# Patient Record
Sex: Female | Born: 1985 | Hispanic: No | Marital: Married | Smoking: Never smoker
Health system: Southern US, Community
[De-identification: ages and names within clinical notes are randomized; demographics above are authoritative.]

## PROBLEM LIST (undated history)

## (undated) ENCOUNTER — Inpatient Hospital Stay: Payer: Self-pay

## (undated) DIAGNOSIS — N83209 Unspecified ovarian cyst, unspecified side: Secondary | ICD-10-CM

## (undated) DIAGNOSIS — O09299 Supervision of pregnancy with other poor reproductive or obstetric history, unspecified trimester: Secondary | ICD-10-CM

## (undated) DIAGNOSIS — F419 Anxiety disorder, unspecified: Secondary | ICD-10-CM

## (undated) DIAGNOSIS — F909 Attention-deficit hyperactivity disorder, unspecified type: Secondary | ICD-10-CM

## (undated) DIAGNOSIS — M2010 Hallux valgus (acquired), unspecified foot: Secondary | ICD-10-CM

## (undated) DIAGNOSIS — N939 Abnormal uterine and vaginal bleeding, unspecified: Secondary | ICD-10-CM

## (undated) HISTORY — DX: Supervision of pregnancy with other poor reproductive or obstetric history, unspecified trimester: O09.299

## (undated) HISTORY — PX: NO PAST SURGERIES: SHX2092

## (undated) HISTORY — DX: Anxiety disorder, unspecified: F41.9

## (undated) HISTORY — DX: Attention-deficit hyperactivity disorder, unspecified type: F90.9

## (undated) HISTORY — DX: Abnormal uterine and vaginal bleeding, unspecified: N93.9

## (undated) HISTORY — DX: Hallux valgus (acquired), unspecified foot: M20.10

## (undated) HISTORY — DX: Unspecified ovarian cyst, unspecified side: N83.209

---
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
## 2005-03-21 ENCOUNTER — Emergency Department: Payer: Self-pay | Admitting: Emergency Medicine

## 2005-07-30 ENCOUNTER — Ambulatory Visit: Payer: Self-pay | Admitting: Obstetrics and Gynecology

## 2005-09-10 ENCOUNTER — Observation Stay: Payer: Self-pay

## 2005-09-16 ENCOUNTER — Inpatient Hospital Stay: Payer: Self-pay | Admitting: Obstetrics and Gynecology

## 2006-11-18 ENCOUNTER — Ambulatory Visit: Payer: Self-pay | Admitting: Surgery

## 2007-11-01 ENCOUNTER — Emergency Department: Payer: Self-pay | Admitting: Emergency Medicine

## 2008-03-23 HISTORY — PX: AUGMENTATION MAMMAPLASTY: SUR837

## 2008-04-06 ENCOUNTER — Ambulatory Visit: Payer: Self-pay | Admitting: Obstetrics and Gynecology

## 2008-04-09 ENCOUNTER — Ambulatory Visit: Payer: Self-pay | Admitting: Obstetrics and Gynecology

## 2008-04-30 ENCOUNTER — Ambulatory Visit: Payer: Self-pay | Admitting: Obstetrics and Gynecology

## 2008-08-04 ENCOUNTER — Emergency Department: Payer: Self-pay | Admitting: Emergency Medicine

## 2008-12-13 ENCOUNTER — Encounter: Payer: Self-pay | Admitting: Obstetrics and Gynecology

## 2009-02-07 ENCOUNTER — Emergency Department: Payer: Self-pay | Admitting: Emergency Medicine

## 2009-02-20 DIAGNOSIS — Z9889 Other specified postprocedural states: Secondary | ICD-10-CM

## 2009-05-16 ENCOUNTER — Observation Stay: Payer: Self-pay | Admitting: Obstetrics and Gynecology

## 2009-06-04 ENCOUNTER — Observation Stay: Payer: Self-pay

## 2009-06-07 ENCOUNTER — Inpatient Hospital Stay: Payer: Self-pay

## 2010-03-09 ENCOUNTER — Emergency Department: Payer: Self-pay | Admitting: Emergency Medicine

## 2013-05-05 ENCOUNTER — Ambulatory Visit: Payer: Self-pay

## 2013-11-13 ENCOUNTER — Ambulatory Visit: Payer: Self-pay

## 2013-11-13 LAB — HM MAMMOGRAPHY

## 2014-10-30 ENCOUNTER — Encounter: Payer: Self-pay | Admitting: Obstetrics and Gynecology

## 2014-11-09 ENCOUNTER — Ambulatory Visit: Payer: PRIVATE HEALTH INSURANCE

## 2014-11-09 ENCOUNTER — Ambulatory Visit (INDEPENDENT_AMBULATORY_CARE_PROVIDER_SITE_OTHER): Payer: PRIVATE HEALTH INSURANCE | Admitting: Obstetrics and Gynecology

## 2014-11-09 ENCOUNTER — Other Ambulatory Visit: Payer: Self-pay | Admitting: Obstetrics and Gynecology

## 2014-11-09 VITALS — BP 118/76 | HR 63 | Ht 63.0 in | Wt 123.4 lb

## 2014-11-09 DIAGNOSIS — O3680X0 Pregnancy with inconclusive fetal viability, not applicable or unspecified: Secondary | ICD-10-CM

## 2014-11-09 DIAGNOSIS — Z8759 Personal history of other complications of pregnancy, childbirth and the puerperium: Principal | ICD-10-CM

## 2014-11-09 DIAGNOSIS — Z113 Encounter for screening for infections with a predominantly sexual mode of transmission: Secondary | ICD-10-CM

## 2014-11-09 DIAGNOSIS — Z1389 Encounter for screening for other disorder: Secondary | ICD-10-CM

## 2014-11-09 DIAGNOSIS — Z36 Encounter for antenatal screening for chromosomal anomalies: Secondary | ICD-10-CM

## 2014-11-09 DIAGNOSIS — O09291 Supervision of pregnancy with other poor reproductive or obstetric history, first trimester: Secondary | ICD-10-CM

## 2014-11-09 DIAGNOSIS — Z369 Encounter for antenatal screening, unspecified: Secondary | ICD-10-CM

## 2014-11-09 DIAGNOSIS — Z331 Pregnant state, incidental: Secondary | ICD-10-CM

## 2014-11-09 LAB — OB RESULTS CONSOLE ABO/RH: RH Type: POSITIVE

## 2014-11-09 LAB — OB RESULTS CONSOLE RUBELLA ANTIBODY, IGM: RUBELLA: NON-IMMUNE/NOT IMMUNE

## 2014-11-09 LAB — OB RESULTS CONSOLE VARICELLA ZOSTER ANTIBODY, IGG: Varicella: NON-IMMUNE/NOT IMMUNE

## 2014-11-09 NOTE — Progress Notes (Cosign Needed)
Brittany Olson for [name] nurse interview visit. G-5.  P-2022. Pregnancy eduction material explained and given. No cats in the home. NOB labs ordered.  HIV screened, pt was informed she may opt out if desires but did not.. Drug screen decline. PNV encouraged. NT ordered to discuss with provider. Pt. To follow up with provider in 2 weeks for NOB physical.  Pt having US done today due to hx of miscarriages and dating.  Has seen [facility] in the past and had genetic testing done but unsure of what. No vaginal bleeding but cramping more than period like, pt states "noticable".   All questions answered.  DOES NOT WANT TO KNOW SEX OF BABY!!!!  ZIKA EXPOSURE SCREEN:  The patient has not traveled to a [place] Virus endemic area within the past 6 months, nor has she had unprotected sex with a partner who has travelled to a [place] endemic region within the past 6 months. The patient has been advised to notify us if these factors change any time during this current pregnancy, so adequate testing and monitoring can be initiated.

## 2014-11-10 LAB — ABO

## 2014-11-10 LAB — URINALYSIS, ROUTINE W REFLEX MICROSCOPIC
Bilirubin, UA: NEGATIVE
Glucose, UA: NEGATIVE
Ketones, UA: NEGATIVE
Leukocytes, UA: NEGATIVE
Nitrite, UA: NEGATIVE
PH UA: 6 (ref 5.0–7.5)
PROTEIN UA: NEGATIVE
RBC UA: NEGATIVE
Specific Gravity, UA: 1.021 (ref 1.005–1.030)
UUROB: 0.2 mg/dL (ref 0.2–1.0)

## 2014-11-10 LAB — HEPATITIS B SURFACE ANTIGEN: Hepatitis B Surface Ag: NEGATIVE

## 2014-11-10 LAB — CBC WITH DIFFERENTIAL/PLATELET
BASOS ABS: 0 10*3/uL (ref 0.0–0.2)
BASOS: 0 %
EOS (ABSOLUTE): 0.1 10*3/uL (ref 0.0–0.4)
Eos: 1 %
Hematocrit: 39.2 % (ref 34.0–46.6)
Hemoglobin: 13.5 g/dL (ref 11.1–15.9)
Immature Grans (Abs): 0 10*3/uL (ref 0.0–0.1)
Immature Granulocytes: 0 %
LYMPHS ABS: 1.4 10*3/uL (ref 0.7–3.1)
LYMPHS: 21 %
MCH: 30.6 pg (ref 26.6–33.0)
MCHC: 34.4 g/dL (ref 31.5–35.7)
MCV: 89 fL (ref 79–97)
MONOS ABS: 0.6 10*3/uL (ref 0.1–0.9)
Monocytes: 8 %
NEUTROS ABS: 4.6 10*3/uL (ref 1.4–7.0)
Neutrophils: 70 %
PLATELETS: 188 10*3/uL (ref 150–379)
RBC: 4.41 x10E6/uL (ref 3.77–5.28)
RDW: 13.7 % (ref 12.3–15.4)
WBC: 6.6 10*3/uL (ref 3.4–10.8)

## 2014-11-10 LAB — RH TYPE: Rh Factor: POSITIVE

## 2014-11-10 LAB — RPR: RPR: NONREACTIVE

## 2014-11-10 LAB — RUBELLA ANTIBODY, IGM: Rubella IgM: <20 AU/mL (ref 0.0–19.9)

## 2014-11-10 LAB — ANTIBODY SCREEN: ANTIBODY SCREEN: NEGATIVE

## 2014-11-10 LAB — HIV ANTIBODY (ROUTINE TESTING W REFLEX): HIV SCREEN 4TH GENERATION: NONREACTIVE

## 2014-11-11 LAB — GC/CHLAMYDIA PROBE AMP
CHLAMYDIA, DNA PROBE: NEGATIVE
Neisseria gonorrhoeae by PCR: NEGATIVE

## 2014-11-11 LAB — URINE CULTURE: ORGANISM ID, BACTERIA: NO GROWTH

## 2014-11-12 LAB — VARICELLA ZOSTER ANTIBODY, IGM

## 2014-11-13 ENCOUNTER — Other Ambulatory Visit: Payer: Self-pay | Admitting: Obstetrics and Gynecology

## 2014-11-13 DIAGNOSIS — O9989 Other specified diseases and conditions complicating pregnancy, childbirth and the puerperium: Principal | ICD-10-CM

## 2014-11-13 DIAGNOSIS — O09899 Supervision of other high risk pregnancies, unspecified trimester: Secondary | ICD-10-CM

## 2014-11-13 DIAGNOSIS — Z283 Underimmunization status: Secondary | ICD-10-CM

## 2014-12-04 ENCOUNTER — Encounter: Payer: Self-pay | Admitting: Obstetrics and Gynecology

## 2014-12-04 ENCOUNTER — Other Ambulatory Visit: Payer: Self-pay | Admitting: Obstetrics and Gynecology

## 2014-12-04 ENCOUNTER — Ambulatory Visit (INDEPENDENT_AMBULATORY_CARE_PROVIDER_SITE_OTHER): Payer: PRIVATE HEALTH INSURANCE | Admitting: Obstetrics and Gynecology

## 2014-12-04 VITALS — BP 107/71 | HR 78 | Wt 125.4 lb

## 2014-12-04 DIAGNOSIS — Z3491 Encounter for supervision of normal pregnancy, unspecified, first trimester: Secondary | ICD-10-CM

## 2014-12-04 DIAGNOSIS — Z3481 Encounter for supervision of other normal pregnancy, first trimester: Secondary | ICD-10-CM

## 2014-12-04 LAB — POCT URINALYSIS DIPSTICK
Bilirubin, UA: NEGATIVE
Glucose, UA: NEGATIVE
KETONES UA: NEGATIVE
Leukocytes, UA: NEGATIVE
Nitrite, UA: NEGATIVE
PROTEIN UA: NEGATIVE
RBC UA: NEGATIVE
SPEC GRAV UA: 1.02
UROBILINOGEN UA: NEGATIVE
pH, UA: 6

## 2014-12-04 NOTE — Patient Instructions (Signed)
Second Trimester of Pregnancy The second trimester is from week 13 through week 28, months 4 through 6. The second trimester is often a time when you feel your best. Your body has also adjusted to being pregnant, and you begin to feel better physically. Usually, morning sickness has lessened or quit completely, you may have more energy, and you may have an increase in appetite. The second trimester is also a time when the fetus is growing rapidly. At the end of the sixth month, the fetus is about 9 inches long and weighs about 1 pounds. You will likely begin to feel the baby move (quickening) between 18 and 20 weeks of the pregnancy. BODY CHANGES Your body goes through many changes during pregnancy. The changes vary from woman to woman.   Your weight will continue to increase. You will notice your lower abdomen bulging out.  You may begin to get stretch marks on your hips, abdomen, and breasts.  You may develop headaches that can be relieved by medicines approved by your health care provider.  You may urinate more often because the fetus is pressing on your bladder.  You may develop or continue to have heartburn as a result of your pregnancy.  You may develop constipation because certain hormones are causing the muscles that push waste through your intestines to slow down.  You may develop hemorrhoids or swollen, bulging veins (varicose veins).  You may have back pain because of the weight gain and pregnancy hormones relaxing your joints between the bones in your pelvis and as a result of a shift in weight and the muscles that support your balance.  Your breasts will continue to grow and be tender.  Your gums may bleed and may be sensitive to brushing and flossing.  Dark spots or blotches (chloasma, mask of pregnancy) may develop on your face. This will likely fade after the baby is born.  A dark line from your belly button to the pubic area (linea nigra) may appear. This will likely fade  after the baby is born.  You may have changes in your hair. These can include thickening of your hair, rapid growth, and changes in texture. Some women also have hair loss during or after pregnancy, or hair that feels dry or thin. Your hair will most likely return to normal after your baby is born. WHAT TO EXPECT AT YOUR PRENATAL VISITS During a routine prenatal visit:  You will be weighed to make sure you and the fetus are growing normally.  Your blood pressure will be taken.  Your abdomen will be measured to track your baby's growth.  The fetal heartbeat will be listened to.  Any test results from the previous visit will be discussed. Your health care provider may ask you:  How you are feeling.  If you are feeling the baby move.  If you have had any abnormal symptoms, such as leaking fluid, bleeding, severe headaches, or abdominal cramping.  If you have any questions. Other tests that may be performed during your second trimester include:  Blood tests that check for:  Low iron levels (anemia).  Gestational diabetes (between 24 and 28 weeks).  Rh antibodies.  Urine tests to check for infections, diabetes, or protein in the urine.  An ultrasound to confirm the proper growth and development of the baby.  An amniocentesis to check for possible genetic problems.  Fetal screens for spina bifida and Down syndrome. HOME CARE INSTRUCTIONS   Avoid all smoking, herbs, alcohol, and unprescribed   drugs. These chemicals affect the formation and growth of the baby.  Follow your health care provider's instructions regarding medicine use. There are medicines that are either safe or unsafe to take during pregnancy.  Exercise only as directed by your health care provider. Experiencing uterine cramps is a good sign to stop exercising.  Continue to eat regular, healthy meals.  Wear a good support bra for breast tenderness.  Do not use hot tubs, steam rooms, or saunas.  Wear your  seat belt at all times when driving.  Avoid raw meat, uncooked cheese, cat litter boxes, and soil used by cats. These carry germs that can cause birth defects in the baby.  Take your prenatal vitamins.  Try taking a stool softener (if your health care provider approves) if you develop constipation. Eat more high-fiber foods, such as fresh vegetables or fruit and whole grains. Drink plenty of fluids to keep your urine clear or pale yellow.  Take warm sitz baths to soothe any pain or discomfort caused by hemorrhoids. Use hemorrhoid cream if your health care provider approves.  If you develop varicose veins, wear support hose. Elevate your feet for 15 minutes, 3-4 times a day. Limit salt in your diet.  Avoid heavy lifting, wear low heel shoes, and practice good posture.  Rest with your legs elevated if you have leg cramps or low back pain.  Visit your dentist if you have not gone yet during your pregnancy. Use a soft toothbrush to brush your teeth and be gentle when you floss.  A sexual relationship may be continued unless your health care provider directs you otherwise.  Continue to go to all your prenatal visits as directed by your health care provider. SEEK MEDICAL CARE IF:   You have dizziness.  You have mild pelvic cramps, pelvic pressure, or nagging pain in the abdominal area.  You have persistent nausea, vomiting, or diarrhea.  You have a bad smelling vaginal discharge.  You have pain with urination. SEEK IMMEDIATE MEDICAL CARE IF:   You have a fever.  You are leaking fluid from your vagina.  You have spotting or bleeding from your vagina.  You have severe abdominal cramping or pain.  You have rapid weight gain or loss.  You have shortness of breath with chest pain.  You notice sudden or extreme swelling of your face, hands, ankles, feet, or legs.  You have not felt your baby move in over an hour.  You have severe headaches that do not go away with  medicine.  You have vision changes. Document Released: 03/03/2001 Document Revised: 03/14/2013 Document Reviewed: 05/10/2012 ExitCare Patient Information 2015 [employer]. This information is not intended to replace advice given to you by your health care provider. Make sure you discuss any questions you have with your health care provider.  

## 2014-12-04 NOTE — Progress Notes (Signed)
NEW OB HISTORY AND PHYSICAL  SUBJECTIVE:       Brittany Olson is a 29 y.o. [id number] female, Patient's last menstrual period was 09/17/2014., Estimated Date of Delivery: 06/24/15, [redacted]w[redacted]d, presents today for establishment of Prenatal Care. She has no unusual complaints and complains of fatigue and occasional low blood sugar      Gynecologic History Patient's last menstrual period was 09/17/2014. Normal Contraception: none Last Pap: unsure. Results were: normal  Obstetric History OB History  Gravida Para Term Preterm AB SAB TAB Ectopic Multiple Living  5 2  1 2 2    2     # Outcome Date GA Lbr Len/2nd Weight Sex Delivery Anes PTL Lv  5 Current           4 Preterm 06/06/09 [redacted]w[redacted]d  8 lb (3.629 kg) F Stormy Card Y  3 SAB 02/2009 [redacted]w[redacted]d       FD     Complications: H/O dilation and curettage  2 SAB 06/2008 [redacted]w[redacted]d       FD  1 Para 09/16/05 [redacted]w[redacted]d  8 lb 12 oz (3.969 kg) F Vag-Spont   Y      Past Medical History  Diagnosis Date  . Ovarian cyst     HISTORY  . Hallux valgus     ACQUIRED  . Perineal laceration during delivery, unspecified, delivered     FIRST DEGREE  . H/O miscarriage, currently pregnant     X2  . Abnormal uterine bleeding (AUB)     history    Past Surgical History  Procedure Laterality Date  . No past surgeries      Current Outpatient Prescriptions on File Prior to Visit  Medication Sig Dispense Refill  . Prenatal Vit-Fe Fumarate-FA (PRENATAL MULTIVITAMIN) TABS tablet Take 1 tablet by mouth daily at 12 noon.     No current facility-administered medications on file prior to visit.    No Known Allergies  Social History   Social History  . Marital Status: Married    Spouse Name: N/A  . Number of Children: N/A  . Years of Education: N/A   Occupational History  . HAIR DRESSER    Social History Main Topics  . Smoking status: Never Smoker   . Smokeless tobacco: Never Used  . Alcohol Use: No  . Drug Use: No  . Sexual Activity:    Partners: Male    Other Topics Concern  . Not on file   Social History Narrative    Family History  Problem Relation Age of Onset  . Prostate cancer Father     The following portions of the patient's history were reviewed and updated as appropriate: allergies, current medications, past OB history, past medical history, past surgical history, past family history, past social history, and problem list.    OBJECTIVE: Initial Physical Exam (New OB)  GENERAL APPEARANCE: alert, well appearing, in no apparent distress, oriented to person, place and time, well hydrated HEAD: normocephalic, atraumatic MOUTH: mucous membranes moist, pharynx normal without lesions THYROID: no thyromegaly or masses present BREASTS: no masses noted, no significant tenderness, no palpable axillary nodes, no skin changes; known fibrous mass noted in left upper outer breast LUNGS: clear to auscultation, no wheezes, rales or rhonchi, symmetric air entry HEART: regular rate and rhythm, no murmurs ABDOMEN: soft, nontender, nondistended, no abnormal masses, no epigastric pain, fundus soft, nontender 12 weeks size and FHT present EXTREMITIES: no redness or tenderness in the calves or thighs SKIN: normal coloration and turgor, no rashes  LYMPH NODES: no adenopathy palpable NEUROLOGIC: alert, oriented, normal speech, no focal findings or movement disorder noted  PELVIC EXAM EXTERNAL GENITALIA: normal appearing vulva with no masses, tenderness or lesions VAGINA: no abnormal discharge or lesions CERVIX: no lesions or cervical motion tenderness UTERUS: gravid and consistent with 12 weeks ADNEXA: no masses palpable and nontender  ASSESSMENT: Normal pregnancy  PLAN: Desires NT and Panarama- will schedule Prenatal care See orders

## 2014-12-05 LAB — CYTOLOGY - PAP

## 2014-12-13 ENCOUNTER — Telehealth: Payer: Self-pay | Admitting: Obstetrics and Gynecology

## 2014-12-13 NOTE — Telephone Encounter (Signed)
Pt is having low sugar issues... Continuously all day. She is eating more than enough and it still drops. Please call!!! When it drops she feels really bad.

## 2014-12-14 ENCOUNTER — Other Ambulatory Visit: Payer: PRIVATE HEALTH INSURANCE

## 2014-12-14 NOTE — Telephone Encounter (Signed)
Pt is already doing the protein shakes, adding extra protein, states it only happens in the mornings and its scary to her She bought some glucose tablets

## 2014-12-14 NOTE — Telephone Encounter (Signed)
Have her continue to try to eat some form of protein q2hr and then her regular meals, may want to try protein shakes.

## 2014-12-18 ENCOUNTER — Other Ambulatory Visit: Payer: PRIVATE HEALTH INSURANCE

## 2014-12-19 ENCOUNTER — Other Ambulatory Visit: Payer: PRIVATE HEALTH INSURANCE

## 2014-12-19 ENCOUNTER — Ambulatory Visit: Payer: PRIVATE HEALTH INSURANCE

## 2014-12-19 ENCOUNTER — Other Ambulatory Visit: Payer: Self-pay | Admitting: Obstetrics and Gynecology

## 2014-12-19 DIAGNOSIS — Z331 Pregnant state, incidental: Secondary | ICD-10-CM

## 2014-12-19 DIAGNOSIS — Z369 Encounter for antenatal screening, unspecified: Secondary | ICD-10-CM

## 2014-12-19 DIAGNOSIS — Z3491 Encounter for supervision of normal pregnancy, unspecified, first trimester: Secondary | ICD-10-CM

## 2014-12-19 DIAGNOSIS — Z3481 Encounter for supervision of other normal pregnancy, first trimester: Secondary | ICD-10-CM

## 2014-12-19 DIAGNOSIS — Z36 Encounter for antenatal screening of mother: Secondary | ICD-10-CM

## 2014-12-28 ENCOUNTER — Encounter: Payer: Self-pay | Admitting: Obstetrics and Gynecology

## 2014-12-28 ENCOUNTER — Other Ambulatory Visit: Payer: Self-pay | Admitting: Obstetrics and Gynecology

## 2015-01-04 ENCOUNTER — Encounter: Payer: Self-pay | Admitting: Obstetrics and Gynecology

## 2015-01-04 ENCOUNTER — Ambulatory Visit (INDEPENDENT_AMBULATORY_CARE_PROVIDER_SITE_OTHER): Payer: Self-pay | Admitting: Obstetrics and Gynecology

## 2015-01-04 VITALS — BP 118/74 | HR 84 | Wt 133.9 lb

## 2015-01-04 DIAGNOSIS — Z3492 Encounter for supervision of normal pregnancy, unspecified, second trimester: Secondary | ICD-10-CM

## 2015-01-04 LAB — POCT URINALYSIS DIPSTICK
Bilirubin, UA: NEGATIVE
Blood, UA: NEGATIVE
GLUCOSE UA: NEGATIVE
KETONES UA: NEGATIVE
Leukocytes, UA: NEGATIVE
Nitrite, UA: NEGATIVE
PROTEIN UA: NEGATIVE
SPEC GRAV UA: 1.01
Urobilinogen, UA: 0.2
pH, UA: 7

## 2015-01-04 MED ORDER — INFLUENZA VAC SPLIT QUAD 0.5 ML IM SUSY
0.5000 mL | PREFILLED_SYRINGE | Freq: Once | INTRAMUSCULAR | Status: AC
Start: 2015-01-04 — End: 2015-01-04
  Administered 2015-01-04: 0.5 mL via INTRAMUSCULAR

## 2015-01-04 NOTE — Progress Notes (Signed)
ROB-pt denies any complaints

## 2015-01-04 NOTE — Addendum Note (Signed)
Addended by: Keturah Barre L on: 01/04/2015 09:29 AM   Modules accepted: Orders

## 2015-01-04 NOTE — Progress Notes (Signed)
ROB- feeling better, no more dizzy spells, reviewed normal panarama , Flu vaccine given

## 2015-01-04 NOTE — Patient Instructions (Signed)
Influenza Virus Vaccine injection What is this medicine? INFLUENZA VIRUS VACCINE (in floo EN zuh VAHY ruhs vak SEEN) helps to reduce the risk of getting influenza also known as the flu. The vaccine only helps protect you against some strains of the flu. This medicine may be used for other purposes; ask your health care provider or pharmacist if you have questions. What should I tell my health care provider before I take this medicine? They need to know if you have any of these conditions: -bleeding disorder like hemophilia -fever or infection -Guillain-Barre syndrome or other neurological problems -immune system problems -infection with the human immunodeficiency virus (HIV) or AIDS -low blood platelet counts -multiple sclerosis -an unusual or allergic reaction to influenza virus vaccine, latex, other medicines, foods, dyes, or preservatives. Different brands of vaccines contain different allergens. Some may contain latex or eggs. Talk to your doctor about your allergies to make sure that you get the right vaccine. -pregnant or trying to get pregnant -breast-feeding How should I use this medicine? This vaccine is for injection into a muscle or under the skin. It is given by a health care professional. A copy of Vaccine Information Statements will be given before each vaccination. Read this sheet carefully each time. The sheet may change frequently. Talk to your healthcare provider to see which vaccines are right for you. Some vaccines should not be used in all age groups. Overdosage: If you think you have taken too much of this medicine contact a poison control center or emergency room at once. NOTE: This medicine is only for you. Do not share this medicine with others. What if I miss a dose? This does not apply. What may interact with this medicine? -chemotherapy or radiation therapy -medicines that lower your immune system like etanercept, anakinra, infliximab, and adalimumab -medicines  that treat or prevent blood clots like warfarin -phenytoin -steroid medicines like prednisone or cortisone -theophylline -vaccines This list may not describe all possible interactions. Give your health care provider a list of all the medicines, herbs, non-prescription drugs, or dietary supplements you use. Also tell them if you smoke, drink alcohol, or use illegal drugs. Some items may interact with your medicine. What should I watch for while using this medicine? Report any side effects that do not go away within 3 days to your doctor or health care professional. Call your health care provider if any unusual symptoms occur within 6 weeks of receiving this vaccine. You may still catch the flu, but the illness is not usually as bad. You cannot get the flu from the vaccine. The vaccine will not protect against colds or other illnesses that may cause fever. The vaccine is needed every year. What side effects may I notice from receiving this medicine? Side effects that you should report to your doctor or health care professional as soon as possible: -allergic reactions like skin rash, itching or hives, swelling of the face, lips, or tongue Side effects that usually do not require medical attention (report to your doctor or health care professional if they continue or are bothersome): -fever -headache -muscle aches and pains -pain, tenderness, redness, or swelling at the injection site -tiredness This list may not describe all possible side effects. Call your doctor for medical advice about side effects. You may report side effects to FDA at 1-800-FDA-1088. Where should I keep my medicine? The vaccine will be given by a health care professional in a clinic, pharmacy, doctor's office, or other health care setting. You will not   be given vaccine doses to store at home. NOTE: This sheet is a summary. It may not cover all possible information. If you have questions about this medicine, talk to your  doctor, pharmacist, or health care provider.    2016, Elsevier/[employer]. (2014-09-28 10:07:28)   Second Trimester of Pregnancy The second trimester is from week 13 through week 28, months 4 through 6. The second trimester is often a time when you feel your best. Your body has also adjusted to being pregnant, and you begin to feel better physically. Usually, morning sickness has lessened or quit completely, you may have more energy, and you may have an increase in appetite. The second trimester is also a time when the fetus is growing rapidly. At the end of the sixth month, the fetus is about 9 inches long and weighs about 1 pounds. You will likely begin to feel the baby move (quickening) between 18 and 20 weeks of the pregnancy. BODY CHANGES Your body goes through many changes during pregnancy. The changes vary from woman to woman.   Your weight will continue to increase. You will notice your lower abdomen bulging out.  You may begin to get stretch marks on your hips, abdomen, and breasts.  You may develop headaches that can be relieved by medicines approved by your health care provider.  You may urinate more often because the fetus is pressing on your bladder.  You may develop or continue to have heartburn as a result of your pregnancy.  You may develop constipation because certain hormones are causing the muscles that push waste through your intestines to slow down.  You may develop hemorrhoids or swollen, bulging veins (varicose veins).  You may have back pain because of the weight gain and pregnancy hormones relaxing your joints between the bones in your pelvis and as a result of a shift in weight and the muscles that support your balance.  Your breasts will continue to grow and be tender.  Your gums may bleed and may be sensitive to brushing and flossing.  Dark spots or blotches (chloasma, mask of pregnancy) may develop on your face. This will likely fade after the baby is  born.  A dark line from your belly button to the pubic area (linea nigra) may appear. This will likely fade after the baby is born.  You may have changes in your hair. These can include thickening of your hair, rapid growth, and changes in texture. Some women also have hair loss during or after pregnancy, or hair that feels dry or thin. Your hair will most likely return to normal after your baby is born. WHAT TO EXPECT AT YOUR PRENATAL VISITS During a routine prenatal visit:  You will be weighed to make sure you and the fetus are growing normally.  Your blood pressure will be taken.  Your abdomen will be measured to track your baby's growth.  The fetal heartbeat will be listened to.  Any test results from the previous visit will be discussed. Your health care provider may ask you:  How you are feeling.  If you are feeling the baby move.  If you have had any abnormal symptoms, such as leaking fluid, bleeding, severe headaches, or abdominal cramping.  If you are using any tobacco products, including cigarettes, chewing tobacco, and electronic cigarettes.  If you have any questions. Other tests that may be performed during your second trimester include:  Blood tests that check for:  Low iron levels (anemia).  Gestational diabetes (between  24 and 28 weeks).  Rh antibodies.  Urine tests to check for infections, diabetes, or protein in the urine.  An ultrasound to confirm the proper growth and development of the baby.  An amniocentesis to check for possible genetic problems.  Fetal screens for spina bifida and Down syndrome.  HIV (human immunodeficiency virus) testing. Routine prenatal testing includes screening for HIV, unless you choose not to have this test. HOME CARE INSTRUCTIONS   Avoid all smoking, herbs, alcohol, and unprescribed drugs. These chemicals affect the formation and growth of the baby.  Do not use any tobacco products, including cigarettes, chewing  tobacco, and electronic cigarettes. If you need help quitting, ask your health care provider. You may receive counseling support and other resources to help you quit.  Follow your health care provider's instructions regarding medicine use. There are medicines that are either safe or unsafe to take during pregnancy.  Exercise only as directed by your health care provider. Experiencing uterine cramps is a good sign to stop exercising.  Continue to eat regular, healthy meals.  Wear a good support bra for breast tenderness.  Do not use hot tubs, steam rooms, or saunas.  Wear your seat belt at all times when driving.  Avoid raw meat, uncooked cheese, cat litter boxes, and soil used by cats. These carry germs that can cause birth defects in the baby.  Take your prenatal vitamins.  Take 1500-2000 mg of calcium daily starting at the 20th week of pregnancy until you deliver your baby.  Try taking a stool softener (if your health care provider approves) if you develop constipation. Eat more high-fiber foods, such as fresh vegetables or fruit and whole grains. Drink plenty of fluids to keep your urine clear or pale yellow.  Take warm sitz baths to soothe any pain or discomfort caused by hemorrhoids. Use hemorrhoid cream if your health care provider approves.  If you develop varicose veins, wear support hose. Elevate your feet for 15 minutes, 3-4 times a day. Limit salt in your diet.  Avoid heavy lifting, wear low heel shoes, and practice good posture.  Rest with your legs elevated if you have leg cramps or low back pain.  Visit your dentist if you have not gone yet during your pregnancy. Use a soft toothbrush to brush your teeth and be gentle when you floss.  A sexual relationship may be continued unless your health care provider directs you otherwise.  Continue to go to all your prenatal visits as directed by your health care provider. SEEK MEDICAL CARE IF:   You have dizziness.  You  have mild pelvic cramps, pelvic pressure, or nagging pain in the abdominal area.  You have persistent nausea, vomiting, or diarrhea.  You have a bad smelling vaginal discharge.  You have pain with urination. SEEK IMMEDIATE MEDICAL CARE IF:   You have a fever.  You are leaking fluid from your vagina.  You have spotting or bleeding from your vagina.  You have severe abdominal cramping or pain.  You have rapid weight gain or loss.  You have shortness of breath with chest pain.  You notice sudden or extreme swelling of your face, hands, ankles, feet, or legs.  You have not felt your baby move in over an hour.  You have severe headaches that do not go away with medicine.  You have vision changes.   This information is not intended to replace advice given to you by your health care provider. Make sure you discuss any  questions you have with your health care provider.   Document Released: 03/03/2001 Document Revised: 03/30/2014 Document Reviewed: 05/10/2012 Elsevier Interactive Patient Education [employer].

## 2015-02-01 ENCOUNTER — Ambulatory Visit (INDEPENDENT_AMBULATORY_CARE_PROVIDER_SITE_OTHER): Payer: PRIVATE HEALTH INSURANCE | Admitting: Obstetrics and Gynecology

## 2015-02-01 ENCOUNTER — Ambulatory Visit: Payer: PRIVATE HEALTH INSURANCE

## 2015-02-01 ENCOUNTER — Encounter: Payer: Self-pay | Admitting: Obstetrics and Gynecology

## 2015-02-01 VITALS — BP 100/64 | HR 61 | Wt 135.1 lb

## 2015-02-01 DIAGNOSIS — Z3492 Encounter for supervision of normal pregnancy, unspecified, second trimester: Secondary | ICD-10-CM

## 2015-02-01 DIAGNOSIS — O4440 Low lying placenta NOS or without hemorrhage, unspecified trimester: Secondary | ICD-10-CM

## 2015-02-01 LAB — POCT URINALYSIS DIPSTICK
Glucose, UA: NEGATIVE
Ketones, UA: NEGATIVE
Nitrite, UA: NEGATIVE
PROTEIN UA: NEGATIVE
RBC UA: NEGATIVE
SPEC GRAV UA: 1.01
UROBILINOGEN UA: 0.2
pH, UA: 7

## 2015-02-01 NOTE — Progress Notes (Signed)
Indications: Anatomy Findings:  Singleton intrauterine pregnancy is visualized with FHR at 152 BPM. Biometrics give an (U/S) Gestational age of [redacted] weeks and 6 days, and an (U/S) EDD of 06/22/15; this correlates with the clinically established EDD of 06/24/15.  Fetal presentation is vertex, spine anterior.  EFW: 314 grams ( 0 lbs. 11 oz. )  Placenta: Posterior, low-lying, 2.5 cm from cervical os with a marginal cord insert. There is also a 2.5 x 1.8 x 3.2 cm probable placental lake located near the cord insert in the placenta. AFI: adequate with MVP at 3.9 cm  Anatomic survey is complete and normal; Gender - Female  PATIENT DOES NOT WANT TO KNOW GENDER!!!!!!  .   Right Ovary measures 3.1 X 1.6 X 2.5 cm. It is normal in appearance. Left Ovary measures 2.5 X 1.4 X 1.4 cm. It is normal appearance. There is no evidence of a corpus luteal cyst. Survey of the adnexa demonstrates no adnexal masses. There is no free peritoneal fluid in the cul de sac.  Impression: 1. 19 week 6 day Viable Singleton Intrauterine pregnancy by U/S. 2. (U/S) EDD is consistent with Clinically established (LMP) EDD of 06/24/15. 3. Low-Lying marginal cord insert placenta (posterior) with placental lake. Suggest repeat ultrasound between 28-30 weeks to reassess. 4. Normal Anatomy Scan  Recommendations: 1.Clinical correlation with the patient's History and Physical Exam.   Elliott,Teresa, Rad Tech Scan reviewed and agree with findings. Counseled patient and will schedule f/u scan at 28 weeks.

## 2015-02-01 NOTE — Progress Notes (Signed)
ROB-denies any changes However she is having some low back pain

## 2015-02-18 ENCOUNTER — Telehealth: Payer: Self-pay | Admitting: Obstetrics and Gynecology

## 2015-02-18 NOTE — Telephone Encounter (Signed)
Pt wants to check on a medication to see if she can take it.

## 2015-02-18 NOTE — Telephone Encounter (Signed)
Pt is c/o yeast inf, advised her ok to try some monistat OTC, she voiced understanding

## 2015-03-01 ENCOUNTER — Encounter: Payer: Self-pay | Admitting: Obstetrics and Gynecology

## 2015-03-01 ENCOUNTER — Ambulatory Visit (INDEPENDENT_AMBULATORY_CARE_PROVIDER_SITE_OTHER): Payer: PRIVATE HEALTH INSURANCE | Admitting: Obstetrics and Gynecology

## 2015-03-01 VITALS — BP 101/64 | HR 73 | Wt 142.1 lb

## 2015-03-01 DIAGNOSIS — O4442 Low lying placenta NOS or without hemorrhage, second trimester: Secondary | ICD-10-CM

## 2015-03-01 DIAGNOSIS — Z3492 Encounter for supervision of normal pregnancy, unspecified, second trimester: Secondary | ICD-10-CM

## 2015-03-01 LAB — POCT URINALYSIS DIPSTICK
Bilirubin, UA: NEGATIVE
Glucose, UA: NEGATIVE
KETONES UA: NEGATIVE
Nitrite, UA: NEGATIVE
PH UA: 6.5
PROTEIN UA: NEGATIVE
RBC UA: NEGATIVE
SPEC GRAV UA: 1.01
Urobilinogen, UA: 0.2

## 2015-03-01 NOTE — Progress Notes (Signed)
ROB- pt denies any new complaints 

## 2015-03-01 NOTE — Progress Notes (Signed)
ROB- having heartburn, worse in evening, Zantac ok- glucola and repeat u/s for LLP next visit

## 2015-03-24 NOTE — L&D Delivery Note (Cosign Needed)
Delivery Note At  a viable and healthy female "Judene Companion" was delivered via  (Presentation:OA ;  ).  APGAR: 8, 9; weight  .   Placenta status: delivered intact with 3 vessel  Cord:  with the following complications: none  Anesthesia:  none Episiotomy:  none Lacerations:  none Suture Repair: NA Est. Blood Loss (mL):  250  Mom to postpartum.  Baby to Couplet care / Skin to Skin.  Bernyce Brimley N Ravinder Hofland,CNM 06/18/2015, 5:23 PM

## 2015-03-29 ENCOUNTER — Ambulatory Visit (INDEPENDENT_AMBULATORY_CARE_PROVIDER_SITE_OTHER): Payer: Self-pay | Admitting: Obstetrics and Gynecology

## 2015-03-29 ENCOUNTER — Encounter: Payer: Self-pay | Admitting: Obstetrics and Gynecology

## 2015-03-29 ENCOUNTER — Other Ambulatory Visit: Payer: PRIVATE HEALTH INSURANCE

## 2015-03-29 ENCOUNTER — Ambulatory Visit (INDEPENDENT_AMBULATORY_CARE_PROVIDER_SITE_OTHER): Payer: Self-pay

## 2015-03-29 VITALS — BP 99/60 | HR 72 | Wt 143.7 lb

## 2015-03-29 DIAGNOSIS — Z131 Encounter for screening for diabetes mellitus: Secondary | ICD-10-CM

## 2015-03-29 DIAGNOSIS — Z331 Pregnant state, incidental: Secondary | ICD-10-CM

## 2015-03-29 DIAGNOSIS — Z23 Encounter for immunization: Secondary | ICD-10-CM

## 2015-03-29 DIAGNOSIS — O4442 Low lying placenta NOS or without hemorrhage, second trimester: Secondary | ICD-10-CM

## 2015-03-29 DIAGNOSIS — Z3492 Encounter for supervision of normal pregnancy, unspecified, second trimester: Secondary | ICD-10-CM

## 2015-03-29 MED ORDER — TETANUS-DIPHTH-ACELL PERTUSSIS 5-2.5-18.5 LF-MCG/0.5 IM SUSP
0.5000 mL | Freq: Once | INTRAMUSCULAR | Status: AC
  Administered 2015-03-29: 0.5 mL via INTRAMUSCULAR

## 2015-03-29 NOTE — Progress Notes (Signed)
ROB-glucola done,blood consent signed, tdap given

## 2015-03-29 NOTE — Progress Notes (Signed)
ROB- glucola done, Tdap given, cord blood donation discussed.  Indications: Limited exam for Low Lying Placenta check. Findings:  Singleton intrauterine pregnancy is visualized with FHR at 152 BPM.   Fetal presentation is vertex, spine posterior.  Placenta: Posterior, grade 1, now remote to cervix (best seen on transvaginal scan) at greater than 6.0 cm from cervical os. The placental lake seen on prior ultrasound is again seen today and measures 1.6 x 0.9 x 0.8 cm which is much smaller than on previous scan and appears WNL. AFI: Subjectively adequate  Anatomic survey of the kidneys, stomach, bladder, heart, and lateral ventricle shows all to be WNL.  Impression: 1. Placenta is no longer low lying at greater than 6.0 cm from the cervical os.  2. Placental lake is now smaller than prior scan and appears WNL.

## 2015-03-30 LAB — HEMOGLOBIN AND HEMATOCRIT, BLOOD
Hematocrit: 34.8 % (ref 34.0–46.6)
Hemoglobin: 12.2 g/dL (ref 11.1–15.9)

## 2015-03-30 LAB — GLUCOSE, 1 HOUR GESTATIONAL: GESTATIONAL DIABETES SCREEN: 100 mg/dL (ref 65–139)

## 2015-04-01 ENCOUNTER — Telehealth: Payer: Self-pay | Admitting: *Deleted

## 2015-04-01 NOTE — Telephone Encounter (Signed)
Notified pt of results 

## 2015-04-01 NOTE — Telephone Encounter (Signed)
-----   Message from Joylene Igo, [place] sent at 03/30/2015 11:08 AM EST ----- Please let her know she passed her glucola and no signs of anemia

## 2015-04-12 ENCOUNTER — Ambulatory Visit (INDEPENDENT_AMBULATORY_CARE_PROVIDER_SITE_OTHER): Payer: PRIVATE HEALTH INSURANCE | Admitting: Obstetrics and Gynecology

## 2015-04-12 ENCOUNTER — Encounter: Payer: Self-pay | Admitting: Obstetrics and Gynecology

## 2015-04-12 VITALS — BP 98/61 | HR 72 | Wt 143.9 lb

## 2015-04-12 DIAGNOSIS — Z331 Pregnant state, incidental: Secondary | ICD-10-CM

## 2015-04-12 LAB — POCT URINALYSIS DIPSTICK
Bilirubin, UA: NEGATIVE
Blood, UA: NEGATIVE
Glucose, UA: NEGATIVE
Ketones, UA: NEGATIVE
LEUKOCYTES UA: NEGATIVE
NITRITE UA: NEGATIVE
PROTEIN UA: NEGATIVE
Spec Grav, UA: 1.01
UROBILINOGEN UA: 0.2
pH, UA: 6

## 2015-04-12 NOTE — Progress Notes (Signed)
ROB-doing well.h/o PTL at 34 weeks with last pregnancy, to reduce hours to 8h/d 4d/wk at 34 weeks,

## 2015-04-12 NOTE — Progress Notes (Signed)
ROB- pt is c/o low back pain, no other complaints

## 2015-04-26 ENCOUNTER — Encounter: Payer: Self-pay | Admitting: Obstetrics and Gynecology

## 2015-04-26 ENCOUNTER — Ambulatory Visit (INDEPENDENT_AMBULATORY_CARE_PROVIDER_SITE_OTHER): Payer: PRIVATE HEALTH INSURANCE | Admitting: Obstetrics and Gynecology

## 2015-04-26 VITALS — BP 108/54 | HR 67 | Wt 145.7 lb

## 2015-04-26 DIAGNOSIS — Z331 Pregnant state, incidental: Secondary | ICD-10-CM

## 2015-04-26 LAB — POCT URINALYSIS DIPSTICK
BILIRUBIN UA: NEGATIVE
Blood, UA: NEGATIVE
Glucose, UA: NEGATIVE
Ketones, UA: NEGATIVE
LEUKOCYTES UA: NEGATIVE
Nitrite, UA: NEGATIVE
Protein, UA: NEGATIVE
Spec Grav, UA: 1.01
Urobilinogen, UA: 0.2
pH, UA: 6

## 2015-04-26 NOTE — Progress Notes (Signed)
ROB-pt is feeling good, is having some pelvic pressure, some cramping

## 2015-04-26 NOTE — Progress Notes (Signed)
ROB- discussed [facility]

## 2015-05-08 ENCOUNTER — Observation Stay
Admission: EM | Admit: 2015-05-08 | Discharge: 2015-05-09 | Disposition: A | Discharge status: Home / Self Care | Payer: Self-pay | Attending: Obstetrics and Gynecology | Admitting: Obstetrics and Gynecology

## 2015-05-08 DIAGNOSIS — O4700 False labor before 37 completed weeks of gestation, unspecified trimester: Secondary | ICD-10-CM | POA: Diagnosis present

## 2015-05-08 DIAGNOSIS — Z3A33 33 weeks gestation of pregnancy: Secondary | ICD-10-CM | POA: Insufficient documentation

## 2015-05-08 DIAGNOSIS — O479 False labor, unspecified: Secondary | ICD-10-CM | POA: Diagnosis present

## 2015-05-08 DIAGNOSIS — O09293 Supervision of pregnancy with other poor reproductive or obstetric history, third trimester: Secondary | ICD-10-CM | POA: Insufficient documentation

## 2015-05-08 DIAGNOSIS — O4690 Antepartum hemorrhage, unspecified, unspecified trimester: Secondary | ICD-10-CM | POA: Diagnosis present

## 2015-05-08 DIAGNOSIS — O43109 Malformation of placenta, unspecified, unspecified trimester: Secondary | ICD-10-CM

## 2015-05-08 DIAGNOSIS — O208 Other hemorrhage in early pregnancy: Principal | ICD-10-CM | POA: Insufficient documentation

## 2015-05-08 MED ORDER — BETAMETHASONE SOD PHOS & ACET 6 (3-3) MG/ML IJ SUSP
INTRAMUSCULAR | Status: AC
  Filled 2015-05-08: qty 1

## 2015-05-08 MED ORDER — SODIUM CHLORIDE 0.9 % IV SOLN: INTRAVENOUS | Status: DC

## 2015-05-08 MED ORDER — ACETAMINOPHEN 325 MG PO TABS: 650.0000 mg | ORAL_TABLET | ORAL | Status: DC | PRN

## 2015-05-08 MED ORDER — PRENATAL MULTIVITAMIN CH
1.0000 | ORAL_TABLET | Freq: Every day | ORAL | Status: DC
  Filled 2015-05-08: qty 1

## 2015-05-08 MED ORDER — TERBUTALINE SULFATE 1 MG/ML IJ SOLN
0.2500 mg | Freq: Once | INTRAMUSCULAR | Status: AC | PRN
  Administered 2015-05-09: 0.25 mg via SUBCUTANEOUS
  Filled 2015-05-08: qty 1

## 2015-05-08 MED ORDER — LACTATED RINGERS IV BOLUS (SEPSIS)
500.0000 mL | Freq: Once | INTRAVENOUS | Status: AC
  Administered 2015-05-08: 500 mL via INTRAVENOUS

## 2015-05-08 MED ORDER — LACTATED RINGERS IV SOLN
INTRAVENOUS | Status: DC
  Administered 2015-05-09: 125 mL/h via INTRAVENOUS

## 2015-05-08 MED ORDER — DOCUSATE SODIUM 100 MG PO CAPS: 100.0000 mg | ORAL_CAPSULE | Freq: Every day | ORAL | Status: DC

## 2015-05-08 MED ORDER — BETAMETHASONE SOD PHOS & ACET 6 (3-3) MG/ML IJ SUSP
12.0000 mg | Freq: Once | INTRAMUSCULAR | Status: AC
  Administered 2015-05-08: 12 mg via INTRAMUSCULAR

## 2015-05-08 MED ORDER — CALCIUM CARBONATE ANTACID 500 MG PO CHEW: 2.0000 | CHEWABLE_TABLET | ORAL | Status: DC | PRN

## 2015-05-08 MED ORDER — ZOLPIDEM TARTRATE 5 MG PO TABS
5.0000 mg | ORAL_TABLET | Freq: Every evening | ORAL | Status: DC | PRN
  Administered 2015-05-09: 5 mg via ORAL
  Filled 2015-05-08: qty 1

## 2015-05-08 NOTE — OB Triage Provider Note (Signed)
Obstetric History and Physical  Brittany Olson is a 30 y.o. [id number] with IUP at [redacted]w[redacted]d presenting with vaginal bleeding and low back pain. Patient states she has been having  [place] all day contractions, moderate vaginal bleeding, intact membranes, with active fetal movement.    Prenatal Course Source of Care: [facility]  Pregnancy complications or risks:previous LLP resolved at 28 weeks, but persistent placental lakes  Prenatal labs and studies: ABO, Rh: O/--/-- (08/19 1130) Antibody: Negative (08/19 1130) Rubella: <20.0 (08/19 1130) RPR: Non Reactive (08/19 1130)  HBsAg: Negative (08/19 1130)  HIV: Non Reactive (08/19 1130)  GBS:  1 hr Glucola  normal Genetic screening normal Anatomy US normal  Past Medical History  Diagnosis Date  . Ovarian cyst     HISTORY  . Hallux valgus     ACQUIRED  . Perineal laceration during delivery, unspecified, delivered     FIRST DEGREE  . H/O miscarriage, currently pregnant     X2  . Abnormal uterine bleeding (AUB)     history    Past Surgical History  Procedure Laterality Date  . No past surgeries      OB History  Gravida Para Term Preterm AB SAB TAB Ectopic Multiple Living  5 2  1 2 2    2     # Outcome Date GA Lbr Len/2nd Weight Sex Delivery Anes PTL Lv  5 Current           4 Preterm 06/06/09 [redacted]w[redacted]d  3.629 kg (8 lb) F Stormy Card Y  3 SAB 02/2009 [redacted]w[redacted]d       FD     Complications: H/O dilation and curettage  2 SAB 06/2008 [redacted]w[redacted]d       FD  1 Para 09/16/05 [redacted]w[redacted]d  3.969 kg (8 lb 12 oz) F Vag-Spont   Y      Social History   Social History  . Marital Status: Married    Spouse Name: N/A  . Number of Children: N/A  . Years of Education: N/A   Occupational History  . HAIR DRESSER    Social History Main Topics  . Smoking status: Never Smoker   . Smokeless tobacco: Never Used  . Alcohol Use: No  . Drug Use: No  . Sexual Activity:    Partners: Male   Other Topics Concern  . Not on file   Social History Narrative    Family  History  Problem Relation Age of Onset  . Prostate cancer Father     Prescriptions prior to admission  Medication Sig Dispense Refill Last Dose  . Prenatal Vit-Fe Fumarate-FA (PRENATAL MULTIVITAMIN) TABS tablet Take 1 tablet by mouth daily at 12 noon.   Taking  . ranitidine (ZANTAC) 150 MG tablet Take 150 mg by mouth 2 (two) times daily.   Taking    No Known Allergies  Review of Systems: Negative except for what is mentioned in HPI.  Physical Exam: Temp(Src) 98.9 F (37.2 C)  Resp 20  LMP 09/17/2014 GENERAL: Well-developed, well-nourished female in no acute distress.  LUNGS: Clear to auscultation bilaterally.  HEART: Regular rate and rhythm. ABDOMEN: Soft, nontender, nondistended, gravid. EXTREMITIES: Nontender, no edema, 2+ distal pulses. Cervical Exam:   FHT:  Baseline rate 135 bpm   Variability moderate  Accelerations present   Decelerations variable Contractions: Every 3-4 mins with uterine irritability   Pertinent Labs/Studies:   No results found for this or any previous visit (from the past 24 hour(s)).  Assessment : Brittany Olson is  a 29 y.o. [id number] at [redacted]w[redacted]d being admitted for preterm labor & vaginal bleeding.  Plan: Steriods, and IVF, with tocolytics as needed FWB: Reassuring fetal heart tracing.  GBS unknown   Lismary Kiehn, CNM [facility]

## 2015-05-09 ENCOUNTER — Other Ambulatory Visit: Payer: Self-pay | Admitting: *Deleted

## 2015-05-09 ENCOUNTER — Observation Stay: Payer: Self-pay

## 2015-05-09 ENCOUNTER — Inpatient Hospital Stay
Admission: RE | Admit: 2015-05-09 | Discharge: 2015-05-09 | Disposition: A | Discharge status: Home / Self Care | Payer: MEDICAID | Attending: Obstetrics and Gynecology | Admitting: Obstetrics and Gynecology

## 2015-05-09 DIAGNOSIS — O43103 Malformation of placenta, unspecified, third trimester: Secondary | ICD-10-CM

## 2015-05-09 DIAGNOSIS — Z3A36 36 weeks gestation of pregnancy: Secondary | ICD-10-CM | POA: Insufficient documentation

## 2015-05-09 DIAGNOSIS — Z3493 Encounter for supervision of normal pregnancy, unspecified, third trimester: Secondary | ICD-10-CM | POA: Insufficient documentation

## 2015-05-09 DIAGNOSIS — Z331 Pregnant state, incidental: Secondary | ICD-10-CM

## 2015-05-09 LAB — URINALYSIS COMPLETE WITH MICROSCOPIC (ARMC ONLY)
Bilirubin Urine: NEGATIVE
Glucose, UA: NEGATIVE mg/dL
KETONES UR: NEGATIVE mg/dL
NITRITE: NEGATIVE
PH: 6 (ref 5.0–8.0)
PROTEIN: NEGATIVE mg/dL
SPECIFIC GRAVITY, URINE: 1.019 (ref 1.005–1.030)

## 2015-05-09 MED ORDER — BETAMETHASONE SOD PHOS & ACET 6 (3-3) MG/ML IJ SUSP
12.0000 mg | Freq: Once | INTRAMUSCULAR | Status: AC
  Administered 2015-05-09: 12 mg via INTRAMUSCULAR

## 2015-05-09 NOTE — Discharge Summary (Signed)
Antenatal Physician Discharge Summary  Patient ID: Laquinta Laurence MRN: [id number] DOB/AGE: 1985-05-17 30 y.o.  Admit date: 05/08/2015 Discharge date: 05/09/2015  Admission Diagnoses:  Discharge Diagnoses:   Prenatal Procedures: NST and ultrasound  Consults: none  Significant Diagnostic Studies:  Results for orders placed or performed during the hospital encounter of 05/08/15 (from the past 168 hour(s))  Urinalysis complete, with microscopic ([facility] only)   Collection Time: 05/08/15 11:36 PM  Result Value Ref Range   Color, Urine YELLOW (A) YELLOW   APPearance CLEAR (A) CLEAR   Glucose, UA NEGATIVE NEGATIVE mg/dL   Bilirubin Urine NEGATIVE NEGATIVE   Ketones, ur NEGATIVE NEGATIVE mg/dL   Specific Gravity, Urine 1.019 1.005 - 1.030   Hgb urine dipstick 3+ (A) NEGATIVE   pH 6.0 5.0 - 8.0   Protein, ur NEGATIVE NEGATIVE mg/dL   Nitrite NEGATIVE NEGATIVE   Leukocytes, UA 1+ (A) NEGATIVE   RBC / HPF TOO NUMEROUS TO COUNT 0 - 5 RBC/hpf   WBC, UA 6-30 0 - 5 WBC/hpf   Bacteria, UA RARE (A) NONE SEEN   Squamous Epithelial / LPF 6-30 (A) NONE SEEN   Mucous PRESENT     Treatments: IV hydration, steroids: betamethasone  1 dose, second dose due 9-11pm and tocolytics x 1 dose  Hospital Course:  This is a 30 y.o. [id number] with IUP at [redacted]w[redacted]d admitted for vaginal bleeding and PTL. She was admitted with contractions, noted to have a cervical exam of 3/40/-2 with moderate red spotting.  No leaking of fluid. She was observed, fetal heart rate monitoring remained reassuring, and she had no signs/symptoms of progressing preterm labor or other maternal-fetal concerns.  Ultrasound confirmed small persistent placental lake, and no abruption or previa.  She was deemed stable for discharge to home with outpatient follow up. To remain on modified berest and pelvic rest Needs f/u appointment in 5 days in our office. To add colace 10mg  bid and Miralax po qod until delivery.  Discharge Exam: BP 95/59 mmHg   Pulse 89  Temp(Src) 98.2 F (36.8 C)  Resp 18  LMP 09/17/2014 General appearance: alert, cooperative and appears stated age GI: soft with mild palpated uterine contractions 5-80minutes apart. Pelvic exam deferred.  Discharge Condition: Stable  Disposition:      Medication List    ASK your doctor about these medications        prenatal multivitamin Tabs tablet  Take 1 tablet by mouth daily at 12 noon.     ranitidine 150 MG tablet  Commonly known as:  ZANTAC  Take 150 mg by mouth 2 (two) times daily.         [place]

## 2015-05-09 NOTE — Discharge Instructions (Signed)
Drink plenty of fluid and get plenty of rest. Pelvic rest and modified bedrest. Call your provider for any other concerns.

## 2015-05-09 NOTE — Progress Notes (Addendum)
Pt arrived to [facility] for second Betamethasone shot. Pt tolerated well. Discharged home in stable condition.

## 2015-05-10 ENCOUNTER — Encounter: Payer: PRIVATE HEALTH INSURANCE | Admitting: Obstetrics and Gynecology

## 2015-05-11 LAB — CULTURE, BETA STREP (GROUP B ONLY)

## 2015-05-14 ENCOUNTER — Encounter: Payer: Self-pay | Admitting: Obstetrics and Gynecology

## 2015-05-14 ENCOUNTER — Ambulatory Visit (INDEPENDENT_AMBULATORY_CARE_PROVIDER_SITE_OTHER): Payer: Self-pay | Admitting: Obstetrics and Gynecology

## 2015-05-14 ENCOUNTER — Other Ambulatory Visit: Payer: PRIVATE HEALTH INSURANCE

## 2015-05-14 VITALS — BP 108/63 | HR 72 | Wt 148.9 lb

## 2015-05-14 DIAGNOSIS — Z331 Pregnant state, incidental: Secondary | ICD-10-CM

## 2015-05-14 LAB — POCT URINALYSIS DIPSTICK
BILIRUBIN UA: NEGATIVE
Glucose, UA: NEGATIVE
KETONES UA: NEGATIVE
LEUKOCYTES UA: NEGATIVE
Nitrite, UA: NEGATIVE
PROTEIN UA: NEGATIVE
Spec Grav, UA: <=1.005
Urobilinogen, UA: 0.2
pH, UA: 6

## 2015-05-14 NOTE — Progress Notes (Signed)
ROB- NST done today

## 2015-05-14 NOTE — Progress Notes (Signed)
ROB & NST for contractions and spotting- NST performed today was reviewed and was found to be reactive. Baseline 135-145 with Moderate variability; No decels noted.  Continue recommended antenatal testing and prenatal care. Only one contraction noted in 20 minutes; to continue bedrest. Will do growth scan next week

## 2015-05-17 ENCOUNTER — Encounter: Payer: PRIVATE HEALTH INSURANCE | Admitting: Obstetrics and Gynecology

## 2015-05-21 ENCOUNTER — Encounter: Payer: Self-pay | Admitting: Obstetrics and Gynecology

## 2015-05-21 ENCOUNTER — Ambulatory Visit (INDEPENDENT_AMBULATORY_CARE_PROVIDER_SITE_OTHER): Payer: Self-pay | Admitting: Obstetrics and Gynecology

## 2015-05-21 ENCOUNTER — Ambulatory Visit (INDEPENDENT_AMBULATORY_CARE_PROVIDER_SITE_OTHER): Payer: Self-pay

## 2015-05-21 VITALS — BP 112/70 | HR 72 | Wt 150.5 lb

## 2015-05-21 DIAGNOSIS — Z331 Pregnant state, incidental: Secondary | ICD-10-CM

## 2015-05-21 DIAGNOSIS — O403XX0 Polyhydramnios, third trimester, not applicable or unspecified: Secondary | ICD-10-CM | POA: Insufficient documentation

## 2015-05-21 LAB — POCT URINALYSIS DIPSTICK
Bilirubin, UA: NEGATIVE
Blood, UA: NEGATIVE
GLUCOSE UA: NEGATIVE
KETONES UA: NEGATIVE
LEUKOCYTES UA: NEGATIVE
Nitrite, UA: NEGATIVE
Protein, UA: NEGATIVE
UROBILINOGEN UA: 0.2
pH, UA: 7

## 2015-05-21 NOTE — Progress Notes (Signed)
Indications:Growth Findings:  Brittany Olson intrauterine pregnancy is visualized with FHR at 144 BPM. Biometrics give an (U/S) Gestational age of 73 2/7 weeks and an (U/S) EDD of 06/23/15; this correlates with the clinically established EDD of 06/24/15.  Fetal presentation is Vertex.  EFW: 2616g (5lb 12oz). Placenta: posterior fundal, grade 1, remote to cervix. AFI: 21.3 ( 95th percentile/polyhydramnios).  Impression: 1. 35 2/7week Viable Singleton Intrauterine pregnancy by U/S. 2. (U/S) EDD is consistent with Clinically established (LMP) EDD of 06/24/15. 3. Normal Growth 4. Polyhydramnios  NST performed today was reviewed and was found to be reactive. Baseline 125 with Moderate variability; No decels noted.  Continue recommended antenatal testing and prenatal care.

## 2015-05-21 NOTE — Progress Notes (Signed)
ROB- NST done today, pt is having some low back pain

## 2015-05-24 ENCOUNTER — Encounter: Payer: PRIVATE HEALTH INSURANCE | Admitting: Obstetrics and Gynecology

## 2015-05-31 ENCOUNTER — Ambulatory Visit (INDEPENDENT_AMBULATORY_CARE_PROVIDER_SITE_OTHER): Payer: PRIVATE HEALTH INSURANCE | Admitting: Obstetrics and Gynecology

## 2015-05-31 ENCOUNTER — Encounter: Payer: Self-pay | Admitting: Obstetrics and Gynecology

## 2015-05-31 VITALS — BP 114/64 | HR 87 | Wt 152.7 lb

## 2015-05-31 DIAGNOSIS — Z113 Encounter for screening for infections with a predominantly sexual mode of transmission: Secondary | ICD-10-CM

## 2015-05-31 DIAGNOSIS — Z331 Pregnant state, incidental: Secondary | ICD-10-CM

## 2015-05-31 LAB — POCT URINALYSIS DIPSTICK
Bilirubin, UA: NEGATIVE
Blood, UA: NEGATIVE
GLUCOSE UA: NEGATIVE
Ketones, UA: 15
NITRITE UA: NEGATIVE
PROTEIN UA: NEGATIVE
Spec Grav, UA: 1.01
UROBILINOGEN UA: 0.2
pH, UA: 7

## 2015-05-31 NOTE — Progress Notes (Signed)
ROB- cultures reobtained, off bedrest, labor precautions discussed.

## 2015-05-31 NOTE — Progress Notes (Signed)
ROB-cultures obtained, pt is feeling pelvic pressure and some contractions

## 2015-06-03 LAB — GC/CHLAMYDIA PROBE AMP
Chlamydia trachomatis, NAA: NEGATIVE
Neisseria gonorrhoeae by PCR: NEGATIVE

## 2015-06-07 ENCOUNTER — Ambulatory Visit (INDEPENDENT_AMBULATORY_CARE_PROVIDER_SITE_OTHER): Payer: PRIVATE HEALTH INSURANCE | Admitting: Obstetrics and Gynecology

## 2015-06-07 VITALS — BP 110/69 | HR 73 | Wt 154.9 lb

## 2015-06-07 DIAGNOSIS — Z3493 Encounter for supervision of normal pregnancy, unspecified, third trimester: Secondary | ICD-10-CM

## 2015-06-07 DIAGNOSIS — Z3685 Encounter for antenatal screening for Streptococcus B: Secondary | ICD-10-CM

## 2015-06-07 DIAGNOSIS — Z36 Encounter for antenatal screening of mother: Secondary | ICD-10-CM

## 2015-06-07 LAB — POCT URINALYSIS DIPSTICK
BILIRUBIN UA: NEGATIVE
GLUCOSE UA: NEGATIVE
KETONES UA: NEGATIVE
NITRITE UA: NEGATIVE
Protein, UA: NEGATIVE
RBC UA: NEGATIVE
Spec Grav, UA: <=1.005
Urobilinogen, UA: 0.2
pH, UA: 7

## 2015-06-07 NOTE — Progress Notes (Signed)
Repeat gbs today. No order last visit.

## 2015-06-07 NOTE — Progress Notes (Signed)
ROB- doing well, some swelling in the evening.

## 2015-06-09 LAB — STREP GP B NAA: Strep Gp B NAA: NEGATIVE

## 2015-06-14 ENCOUNTER — Encounter: Payer: Self-pay | Admitting: Obstetrics and Gynecology

## 2015-06-14 ENCOUNTER — Ambulatory Visit (INDEPENDENT_AMBULATORY_CARE_PROVIDER_SITE_OTHER): Payer: PRIVATE HEALTH INSURANCE | Admitting: Obstetrics and Gynecology

## 2015-06-14 VITALS — BP 102/80 | HR 78 | Wt 156.9 lb

## 2015-06-14 DIAGNOSIS — Z331 Pregnant state, incidental: Secondary | ICD-10-CM

## 2015-06-14 LAB — POCT URINALYSIS DIPSTICK
Glucose, UA: NEGATIVE
KETONES UA: NEGATIVE
Nitrite, UA: NEGATIVE
PH UA: 6
Protein, UA: NEGATIVE
RBC UA: NEGATIVE
SPEC GRAV UA: 1.015
Urobilinogen, UA: 0.2

## 2015-06-14 NOTE — H&P (Signed)
Brittany Olson is a 30 y.o. [id number] female at [redacted]w[redacted]d presenting for IOL secondary to previous LGA infant and 4th degree laceration.     History OB History    Gravida Para Term Preterm AB TAB SAB Ectopic Multiple Living   5 2  1 2  2   2      Past Medical History  Diagnosis Date  . Ovarian cyst     HISTORY  . Hallux valgus     ACQUIRED  . Perineal laceration during delivery, unspecified, delivered     FIRST DEGREE  . H/O miscarriage, currently pregnant     X2  . Abnormal uterine bleeding (AUB)     history   Past Surgical History  Procedure Laterality Date  . No past surgeries     Family History: family history includes Prostate cancer in her father. Social History:  reports that she has never smoked. She has never used smokeless tobacco. She reports that she does not drink alcohol or use illicit drugs.   Prenatal Transfer Tool  Maternal Diabetes: No Genetic Screening: Declined Maternal Ultrasounds/Referrals: Normal Fetal Ultrasounds or other Referrals:  None Maternal Substance Abuse:  No Significant Maternal Medications:  None Significant Maternal Lab Results:  None Other Comments:  None  ROS  Dilation: 3.5 Effacement (%): 70 Station: -2 Blood pressure 102/80, pulse 78, weight 156 lb 14.4 oz (71.169 kg), last menstrual period 09/17/2014. Exam Physical Exam  Prenatal labs: ABO, Rh: O/--/-- (08/19 1130) Antibody: Negative (08/19 1130) Rubella: <20.0 (08/19 1130) RPR: Non Reactive (08/19 1130)  HBsAg: Negative (08/19 1130)  HIV: Non Reactive (08/19 1130)  GBS: Negative (03/17 1154)   Assessment/Plan: IOL at [redacted]w[redacted]d with pitocin per policy, hopeful for uncomplicated vaginal delivery.   [place], CNM 06/14/2015, 12:03 PM

## 2015-06-14 NOTE — Progress Notes (Signed)
ROB-pt is having some pelvic pressure, lots of contractions, ?? Leaking fluid

## 2015-06-14 NOTE — Progress Notes (Signed)
ROB- labor precautions discussed- IOL scheduled for 3/27 due to previous LGA and 4th degree laceration.

## 2015-06-15 ENCOUNTER — Other Ambulatory Visit: Payer: Self-pay | Admitting: Obstetrics and Gynecology

## 2015-06-15 MED ORDER — FLUCONAZOLE 150 MG PO TABS: 150.0000 mg | ORAL_TABLET | Freq: Once | ORAL | Status: DC

## 2015-06-18 ENCOUNTER — Inpatient Hospital Stay
Admission: EM | Admit: 2015-06-18 | Discharge: 2015-06-20 | DRG: 775 | Disposition: A | Discharge status: Home / Self Care | Payer: MEDICAID | Attending: Obstetrics and Gynecology | Admitting: Obstetrics and Gynecology

## 2015-06-18 DIAGNOSIS — Z3A39 39 weeks gestation of pregnancy: Secondary | ICD-10-CM

## 2015-06-18 DIAGNOSIS — O403XX0 Polyhydramnios, third trimester, not applicable or unspecified: Secondary | ICD-10-CM

## 2015-06-18 DIAGNOSIS — Z3493 Encounter for supervision of normal pregnancy, unspecified, third trimester: Secondary | ICD-10-CM

## 2015-06-18 DIAGNOSIS — O4690 Antepartum hemorrhage, unspecified, unspecified trimester: Secondary | ICD-10-CM

## 2015-06-18 DIAGNOSIS — Z23 Encounter for immunization: Secondary | ICD-10-CM

## 2015-06-18 DIAGNOSIS — Z789 Other specified health status: Secondary | ICD-10-CM | POA: Diagnosis present

## 2015-06-18 LAB — CBC
HEMATOCRIT: 35.8 % (ref 35.0–47.0)
HEMOGLOBIN: 12.8 g/dL (ref 12.0–16.0)
MCH: 30.6 pg (ref 26.0–34.0)
MCHC: 35.8 g/dL (ref 32.0–36.0)
MCV: 85.4 fL (ref 80.0–100.0)
Platelets: 153 10*3/uL (ref 150–440)
RBC: 4.19 MIL/uL (ref 3.80–5.20)
RDW: 13.9 % (ref 11.5–14.5)
WBC: 8.9 10*3/uL (ref 3.6–11.0)

## 2015-06-18 LAB — TYPE AND SCREEN
ABO/RH(D): O POS
ANTIBODY SCREEN: NEGATIVE

## 2015-06-18 LAB — ABO/RH: ABO/RH(D): O POS

## 2015-06-18 MED ORDER — TERBUTALINE SULFATE 1 MG/ML IJ SOLN: 0.2500 mg | Freq: Once | INTRAMUSCULAR | Status: DC | PRN

## 2015-06-18 MED ORDER — AMMONIA AROMATIC IN INHA
RESPIRATORY_TRACT | Status: AC
  Filled 2015-06-18: qty 10

## 2015-06-18 MED ORDER — ONDANSETRON HCL 4 MG PO TABS: 4.0000 mg | ORAL_TABLET | ORAL | Status: DC | PRN

## 2015-06-18 MED ORDER — ONDANSETRON HCL 4 MG/2ML IJ SOLN
4.0000 mg | INTRAMUSCULAR | Status: DC | PRN
  Administered 2015-06-18: 4 mg via INTRAVENOUS
  Filled 2015-06-18: qty 2

## 2015-06-18 MED ORDER — MEASLES, MUMPS & RUBELLA VAC ~~LOC~~ INJ
0.5000 mL | INJECTION | Freq: Once | SUBCUTANEOUS | Status: AC
  Administered 2015-06-20: 0.5 mL via SUBCUTANEOUS
  Filled 2015-06-18 (×2): qty 0.5

## 2015-06-18 MED ORDER — ZOLPIDEM TARTRATE 5 MG PO TABS: 5.0000 mg | ORAL_TABLET | Freq: Every evening | ORAL | Status: DC | PRN

## 2015-06-18 MED ORDER — PRENATAL MULTIVITAMIN CH
1.0000 | ORAL_TABLET | Freq: Every day | ORAL | Status: DC
  Filled 2015-06-18 (×2): qty 1

## 2015-06-18 MED ORDER — BENZOCAINE-MENTHOL 20-0.5 % EX AERO: 1.0000 "application " | INHALATION_SPRAY | CUTANEOUS | Status: DC | PRN

## 2015-06-18 MED ORDER — DOCUSATE SODIUM 100 MG PO CAPS
100.0000 mg | ORAL_CAPSULE | Freq: Two times a day (BID) | ORAL | Status: DC
  Filled 2015-06-18 (×3): qty 1

## 2015-06-18 MED ORDER — WITCH HAZEL-GLYCERIN EX PADS: 1.0000 "application " | MEDICATED_PAD | CUTANEOUS | Status: DC | PRN

## 2015-06-18 MED ORDER — SIMETHICONE 80 MG PO CHEW
80.0000 mg | CHEWABLE_TABLET | ORAL | Status: DC | PRN
  Administered 2015-06-18: 80 mg via ORAL
  Filled 2015-06-18: qty 1

## 2015-06-18 MED ORDER — CITRIC ACID-SODIUM CITRATE 334-500 MG/5ML PO SOLN: 30.0000 mL | ORAL | Status: DC | PRN

## 2015-06-18 MED ORDER — IBUPROFEN 600 MG PO TABS
600.0000 mg | ORAL_TABLET | Freq: Four times a day (QID) | ORAL | Status: DC
  Filled 2015-06-18 (×3): qty 1

## 2015-06-18 MED ORDER — LACTATED RINGERS IV SOLN: 500.0000 mL | INTRAVENOUS | Status: DC | PRN

## 2015-06-18 MED ORDER — OXYCODONE HCL 5 MG PO TABS: 10.0000 mg | ORAL_TABLET | ORAL | Status: DC | PRN

## 2015-06-18 MED ORDER — OXYTOCIN 40 UNITS IN LACTATED RINGERS INFUSION - SIMPLE MED
1.0000 m[IU]/min | INTRAVENOUS | Status: DC
  Administered 2015-06-18: 1 m[IU]/min via INTRAVENOUS
  Filled 2015-06-18: qty 1000

## 2015-06-18 MED ORDER — LACTATED RINGERS IV SOLN
INTRAVENOUS | Status: DC
  Administered 2015-06-18 (×2): via INTRAVENOUS

## 2015-06-18 MED ORDER — FENTANYL 2.5 MCG/ML W/ROPIVACAINE 0.2% IN NS 100 ML EPIDURAL INFUSION (ARMC-ANES)
EPIDURAL | Status: AC
  Filled 2015-06-18: qty 100

## 2015-06-18 MED ORDER — LANOLIN HYDROUS EX OINT: TOPICAL_OINTMENT | CUTANEOUS | Status: DC | PRN

## 2015-06-18 MED ORDER — OXYTOCIN 40 UNITS IN LACTATED RINGERS INFUSION - SIMPLE MED
2.5000 [IU]/h | INTRAVENOUS | Status: DC
  Administered 2015-06-18: 2.5 [IU]/h via INTRAVENOUS

## 2015-06-18 MED ORDER — OXYCODONE HCL 5 MG PO TABS: 5.0000 mg | ORAL_TABLET | ORAL | Status: DC | PRN

## 2015-06-18 MED ORDER — FENTANYL CITRATE (PF) 100 MCG/2ML IJ SOLN
50.0000 ug | INTRAMUSCULAR | Status: DC | PRN
  Administered 2015-06-18: 100 ug via INTRAVENOUS
  Filled 2015-06-18: qty 2

## 2015-06-18 MED ORDER — DOCUSATE SODIUM 100 MG PO CAPS: 100.0000 mg | ORAL_CAPSULE | Freq: Two times a day (BID) | ORAL | Status: DC

## 2015-06-18 MED ORDER — ACETAMINOPHEN 325 MG PO TABS: 650.0000 mg | ORAL_TABLET | ORAL | Status: DC | PRN

## 2015-06-18 MED ORDER — DIPHENHYDRAMINE HCL 25 MG PO CAPS: 25.0000 mg | ORAL_CAPSULE | Freq: Four times a day (QID) | ORAL | Status: DC | PRN

## 2015-06-18 MED ORDER — OXYTOCIN BOLUS FROM INFUSION
500.0000 mL | INTRAVENOUS | Status: DC
  Administered 2015-06-18: 500 mL via INTRAVENOUS

## 2015-06-18 MED ORDER — LIDOCAINE HCL (PF) 1 % IJ SOLN
30.0000 mL | INTRAMUSCULAR | Status: DC | PRN
  Filled 2015-06-18: qty 30

## 2015-06-18 MED ORDER — VARICELLA VIRUS VACCINE LIVE 1350 PFU/0.5ML IJ SUSR
0.5000 mL | Freq: Once | INTRAMUSCULAR | Status: AC
  Administered 2015-06-20: 0.5 mL via SUBCUTANEOUS
  Filled 2015-06-18 (×2): qty 0.5

## 2015-06-18 MED ORDER — MISOPROSTOL 200 MCG PO TABS
ORAL_TABLET | ORAL | Status: AC
  Filled 2015-06-18: qty 4

## 2015-06-18 MED ORDER — DIBUCAINE 1 % RE OINT: 1.0000 "application " | TOPICAL_OINTMENT | RECTAL | Status: DC | PRN

## 2015-06-18 MED ORDER — ONDANSETRON HCL 4 MG/2ML IJ SOLN: 4.0000 mg | Freq: Four times a day (QID) | INTRAMUSCULAR | Status: DC | PRN

## 2015-06-18 NOTE — Progress Notes (Signed)
Brittany Olson is a 30 y.o. [id number] at [redacted]w[redacted]d by LMP admitted for induction of labor due to Hydramnios.  Subjective: Reports irregular mild contractions  Objective: LMP 09/17/2014       SVE:    3.5/50/-1  Labs: Lab Results  Component Value Date   WBC 6.6 11/09/2014   HCT 34.8 03/29/2015   MCV 89 11/09/2014   PLT 188 11/09/2014    Assessment / Plan: Induction of labor due to polyhydramnious,  progressing well on pitocin  Labor: pitocin IOL Preeclampsia:  NA Fetal Wellbeing:  Category I Pain Control:  none now I/D:  n/a Anticipated MOD:  NSVD  Samuele Storey Rockney Ghee, CNM 06/18/2015, 8:27 AM

## 2015-06-18 NOTE — Progress Notes (Signed)
Brittany Olson is a 30 y.o. [id number] at [redacted]w[redacted]d by LMP admitted for induction of labor due to Hydramnios.  Subjective: Reports some pai with contractions, not unbearable  Objective: BP 104/77 mmHg  Pulse 73  Temp(Src) 97.9 F (36.6 C) (Oral)  Resp 18  Ht 5\' 3"  (1.6 m)  Wt 156 lb (70.761 kg)  BMI 27.64 kg/m2  LMP 09/17/2014      FHT:  FHR: 145 bpm, variability: moderate,  accelerations:  Present,  decelerations:  Absent UC:   irregular, every 2-4 minutes on 5 mu/min pitocin SVE:   Dilation: 5 Effacement (%): 70 Station: -1 Exam by:: MS, CNM  Labs: Lab Results  Component Value Date   WBC 8.9 06/18/2015   HGB 12.8 06/18/2015   HCT 35.8 06/18/2015   MCV 85.4 06/18/2015   PLT 153 06/18/2015    Assessment / Plan: Induction of labor due to polyhydramnious,  progressing well on pitocin  Labor: Progressing on Pitocin, will continue to increase then AROM-AROM now with copious amounts clear fluid Preeclampsia:  labs stable Fetal Wellbeing:  Category I Pain Control:  Labor support without medications I/D:  n/a Anticipated MOD:  NSVD  [employer], CNM 06/18/2015, 12:12 PM

## 2015-06-19 LAB — CBC
HCT: 34 % — ABNORMAL LOW (ref 35.0–47.0)
Hemoglobin: 12 g/dL (ref 12.0–16.0)
MCH: 30 pg (ref 26.0–34.0)
MCHC: 35.3 g/dL (ref 32.0–36.0)
MCV: 85.1 fL (ref 80.0–100.0)
Platelets: 147 K/uL — ABNORMAL LOW (ref 150–440)
RBC: 4 MIL/uL (ref 3.80–5.20)
RDW: 13.7 % (ref 11.5–14.5)
WBC: 11.9 K/uL — ABNORMAL HIGH (ref 3.6–11.0)

## 2015-06-19 LAB — RPR: RPR Ser Ql: NONREACTIVE

## 2015-06-19 NOTE — Progress Notes (Signed)
Post Partum Day 1 Subjective: no complaints, up ad lib and voiding  Objective: Blood pressure 107/67, pulse 76, temperature 98 F (36.7 C), temperature source Oral, resp. rate 20, height 5\' 3"  (1.6 m), weight 156 lb (70.761 kg), last menstrual period 09/17/2014, SpO2 99 %.  Physical Exam:  General: alert, cooperative and appears stated age Lochia: appropriate Uterine Fundus: firm Incision: NA DVT Evaluation: No evidence of DVT seen on physical exam. Negative Homan's sign.   Recent Labs  06/18/15 0858 06/19/15 0545  HGB 12.8 12.0  HCT 35.8 34.0*    Assessment/Plan: Plan for discharge tomorrow and Breastfeeding Infant feeding onlyBreast    LOS: 1 day   [employer], CNM 06/19/2015, 8:23 AM

## 2015-06-20 ENCOUNTER — Other Ambulatory Visit: Payer: Self-pay | Admitting: Obstetrics and Gynecology

## 2015-06-20 MED ORDER — VITAMIN D3 125 MCG (5000 UT) PO CAPS: 1.0000 | ORAL_CAPSULE | Freq: Every day | ORAL | Status: DC

## 2015-06-20 MED ORDER — IBUPROFEN 800 MG PO TABS: 800.0000 mg | ORAL_TABLET | Freq: Three times a day (TID) | ORAL | Status: DC | PRN

## 2015-06-20 MED ORDER — NORETHINDRONE 0.35 MG PO TABS: 1.0000 | ORAL_TABLET | Freq: Every day | ORAL | Status: DC

## 2015-06-20 NOTE — Discharge Summary (Signed)
Obstetric Discharge Summary Reason for Admission: induction of labor Prenatal Procedures: ultrasound Intrapartum Procedures: spontaneous vaginal delivery Postpartum Procedures: Rubella Ig and varicella vaccine Complications-Operative and Postpartum: none HEMOGLOBIN  Date Value Ref Range Status  06/19/2015 12.0 12.0 - 16.0 g/dL Final   HCT  Date Value Ref Range Status  06/19/2015 34.0* 35.0 - 47.0 % Final   HEMATOCRIT  Date Value Ref Range Status  03/29/2015 34.8 34.0 - 46.6 % Final    Physical Exam:  General: alert, cooperative and appears stated age 30: appropriate Uterine Fundus: firm Incision: NA DVT Evaluation: No evidence of DVT seen on physical exam. Negative Homan's sign.  Discharge Diagnoses: Term Pregnancy-delivered  Discharge Information: Date: 06/20/2015 Activity: pelvic rest Diet: routine Medications: PNV, Ibuprofen and Colace Condition: stable Instructions: refer to practice specific booklet Discharge to: home   Newborn Data: Live born female  Birth Weight: 8 lb 9.6 oz (3900 g) APGAR: 8, 9  Home with mother.  Macarius Ruark [employer], CNM 06/20/2015, 8:06 AM

## 2015-06-20 NOTE — Discharge Instructions (Signed)
Call your doctor for increased pain or vaginal bleeding, temperature above 100.4, depression, or concerns.  No strenuous activity or heavy lifting for 6 weeks.  No intercourse, tampons, douching, or enemas for 6 weeks.  No tub baths-showers only.  No driving for 2 weeks or while taking pain medications.  Continue prenatal vitamin and iron.  Increase calories and fluids while breastfeeding. °

## 2015-06-20 NOTE — Progress Notes (Signed)
Discharge instructions provided.  Pt and sig other verbalize understanding of all instructions and follow-up care.  Prescription given.  Pt discharged to home with infant at 1330 on 06/20/15 via wheelchair by RN. Reed Breech, RN 06/20/2015 1:55 PM

## 2015-06-20 NOTE — Progress Notes (Signed)
Pt states that she received TDaP vaccine and Influenza vaccines during pregnancy.  Pt declines vaccines at this time. Reed Breech, RN 06/20/2015 9:11 AM

## 2015-06-27 ENCOUNTER — Telehealth: Payer: Self-pay | Admitting: Obstetrics and Gynecology

## 2015-06-27 NOTE — Telephone Encounter (Signed)
It shouldn't be related to belly wrap, and can be normal, to drink a lot of water and take it easy for a few days and bleeding should slow down again.

## 2015-06-27 NOTE — Telephone Encounter (Signed)
Notified pt. 

## 2015-06-27 NOTE — Telephone Encounter (Signed)
pls advise

## 2015-06-27 NOTE — Telephone Encounter (Signed)
Brittany Olson quit bleeding 1 week after baby was born and then started wearing a shinks belly wrap (like a velcroe wrap to hiold you in. She started bleeding heavily again and wants to know if this is normal or does it have to do with the belly wrap.

## 2015-07-30 ENCOUNTER — Ambulatory Visit (INDEPENDENT_AMBULATORY_CARE_PROVIDER_SITE_OTHER): Payer: Self-pay | Admitting: Obstetrics and Gynecology

## 2015-07-30 ENCOUNTER — Encounter: Payer: Self-pay | Admitting: Obstetrics and Gynecology

## 2015-07-30 NOTE — Progress Notes (Signed)
  Subjective:     Brittany Olson is a 30 y.o. female who presents for a postpartum visit. She is 5 weeks postpartum following a spontaneous vaginal delivery. I have fully reviewed the prenatal and intrapartum course. The delivery was at 15 gestational weeks. Outcome: spontaneous vaginal delivery. Anesthesia: none. Postpartum course has been uncomplicated. Baby's course has been uncomplicated. Baby is feeding by breast. Bleeding no bleeding. Bowel function is normal. Bladder function is normal. Patient is not sexually active. Contraception method is abstinence and oral progesterone-only contraceptive. Postpartum depression screening: negative.  The following portions of the patient's history were reviewed and updated as appropriate: allergies, current medications, past family history, past medical history, past social history, past surgical history and problem list.  Review of Systems A comprehensive review of systems was negative.   Objective:    BP 108/64 mmHg  Pulse 72  Ht 5\' 3"  (1.6 m)  Wt 133 lb 4.8 oz (60.464 kg)  BMI 23.62 kg/m2  Breastfeeding? Yes  General:  alert, cooperative and appears stated age   Breasts:  not examined  Lungs: clear to auscultation bilaterally  Heart:  regular rate and rhythm, S1, S2 normal, no murmur, click, rub or gallop  Abdomen: soft, non-tender; bowel sounds normal; no masses,  no organomegaly   Vulva:  normal  Vagina: normal vagina, no discharge, exudate, lesion, or erythema  Cervix:  multiparous appearance  Corpus: normal size, contour, position, consistency, mobility, non-tender  Adnexa:  no mass, fullness, tenderness  Rectal Exam: Not performed.        Assessment:     5 weeks  postpartum exam. Pap smear not done at today's visit.   Plan:    1. Contraception: oral progesterone-only contraceptive 2. breastfeeding 3. Follow up in: 4 months or as needed.

## 2015-07-30 NOTE — Patient Instructions (Signed)
  Place postpartum visit patient instructions here.  

## 2015-08-22 ENCOUNTER — Telehealth: Payer: Self-pay | Admitting: Obstetrics and Gynecology

## 2015-08-22 NOTE — Telephone Encounter (Signed)
pls advise

## 2015-08-22 NOTE — Telephone Encounter (Signed)
Ms. Lenington called saying she was told to notify the office of when she stopped breast feeding so her birth control medication can be changed. She's stopped breast feeding. She also called asking about the status of past bills she's received. She said they should've reflected a 55% discount and they don't. She was told it would take a while to fix but is wondering how long. She doesn't want the bill amounts to go into collections due to their not being paid. She'd like a phone call concerning these issues.  Pt's ph# [phone] Thank you.

## 2015-08-23 ENCOUNTER — Other Ambulatory Visit: Payer: Self-pay | Admitting: Obstetrics and Gynecology

## 2015-08-23 MED ORDER — NORGESTIMATE-ETH ESTRADIOL 0.25-35 MG-MCG PO TABS: 1.0000 | ORAL_TABLET | Freq: Every day | ORAL | Status: DC

## 2016-06-29 ENCOUNTER — Other Ambulatory Visit: Payer: Self-pay | Admitting: *Deleted

## 2016-06-29 ENCOUNTER — Telehealth: Payer: Self-pay | Admitting: Obstetrics and Gynecology

## 2016-06-29 MED ORDER — NORGESTIMATE-ETH ESTRADIOL 0.25-35 MG-MCG PO TABS: 1.0000 | ORAL_TABLET | Freq: Every day | ORAL | 4 refills | Status: DC

## 2016-06-29 NOTE — Telephone Encounter (Signed)
Done-ac 

## 2016-06-29 NOTE — Telephone Encounter (Signed)
Patient needs refill on birthcontrol  [employer]

## 2016-09-16 ENCOUNTER — Encounter: Payer: Self-pay | Admitting: Obstetrics and Gynecology

## 2016-09-16 ENCOUNTER — Ambulatory Visit (INDEPENDENT_AMBULATORY_CARE_PROVIDER_SITE_OTHER): Payer: Self-pay | Admitting: Obstetrics and Gynecology

## 2016-09-16 ENCOUNTER — Other Ambulatory Visit: Payer: Self-pay | Admitting: Obstetrics and Gynecology

## 2016-09-16 VITALS — BP 118/78 | HR 98 | Ht 63.0 in | Wt 125.7 lb

## 2016-09-16 DIAGNOSIS — Z01419 Encounter for gynecological examination (general) (routine) without abnormal findings: Secondary | ICD-10-CM

## 2016-09-16 MED ORDER — NORGESTIMATE-ETH ESTRADIOL 0.25-35 MG-MCG PO TABS: 1.0000 | ORAL_TABLET | Freq: Every day | ORAL | 4 refills | Status: DC

## 2016-09-16 NOTE — Progress Notes (Signed)
Subjective:   Brittany Olson is a 31 y.o. [id number] Caucasian female here for a routine well-woman exam.  Patient's last menstrual period was 09/06/2016.    Current complaints: occasional skipped heart beats PCP: me       does desire labs  Social History: Sexual: heterosexual Marital Status: married Living situation: with family Occupation: self-employed Tobacco/alcohol: no tobacco use Illicit drugs: no history of illicit drug use  The following portions of the patient's history were reviewed and updated as appropriate: allergies, current medications, past family history, past medical history, past social history, past surgical history and problem list.  Past Medical History Past Medical History:  Diagnosis Date  . Abnormal uterine bleeding (AUB)    history  . H/O miscarriage, currently pregnant    X2  . Hallux valgus    ACQUIRED  . Ovarian cyst    HISTORY  . Perineal laceration during delivery, unspecified, delivered    FIRST DEGREE    Past Surgical History Past Surgical History:  Procedure Laterality Date  . NO PAST SURGERIES      Gynecologic History [id number]  Patient's last menstrual period was 09/06/2016. Contraception: OCP (estrogen/progesterone) Last Pap: 11/2014. Results were: normal   Obstetric History OB History  Gravida Para Term Preterm AB Living  5 2   1 2 2   SAB TAB Ectopic Multiple Live Births  2       2    # Outcome Date GA Lbr Len/2nd Weight Sex Delivery Anes PTL Lv  5 Preterm 06/06/09 [redacted]w[redacted]d  8 lb (3.629 kg) F Vag-Spont  Y LIV  4 SAB 02/2009 [redacted]w[redacted]d       FD     Complications: H/O dilation and curettage  3 SAB 06/2008 [redacted]w[redacted]d       FD  2 Para 09/16/05 [redacted]w[redacted]d  8 lb 12 oz (3.969 kg) F Vag-Spont   LIV  1 Gravida               Current Medications Current Outpatient Prescriptions on File Prior to Visit  Medication Sig Dispense Refill  . norgestimate-ethinyl estradiol (ORTHO-CYCLEN,SPRINTEC,PREVIFEM) 0.25-35 MG-MCG tablet Take 1 tablet by mouth  daily. 3 Package 4  . Cholecalciferol (VITAMIN D3) 5000 units CAPS Take 1 capsule (5,000 Units total) by mouth daily. (Patient not taking: Reported on 09/16/2016) 120 capsule 2  . Prenatal Vit-Fe Fumarate-FA (PRENATAL MULTIVITAMIN) TABS tablet Take 1 tablet by mouth daily at 12 noon.     No current facility-administered medications on file prior to visit.     Review of Systems Patient denies any headaches, blurred vision, shortness of breath, chest pain, abdominal pain, problems with bowel movements, urination, or intercourse.  Objective:  BP 118/78   Pulse 98   Ht 5\' 3"  (1.6 m)   Wt 125 lb 11.2 oz (57 kg)   LMP 09/06/2016   BMI 22.27 kg/m  Physical Exam  General:  Well developed, well nourished, no acute distress. She is alert and oriented x3. Skin:  Warm and dry Neck:  Midline trachea, no thyromegaly or nodules Cardiovascular: Regular rate and rhythm, no murmur heard Lungs:  Effort normal, all lung fields clear to auscultation bilaterally Breasts:  No dominant palpable mass, retraction, or nipple discharge Abdomen:  Soft, non tender, no hepatosplenomegaly or masses Pelvic:  External genitalia is normal in appearance.  The vagina is normal in appearance. The cervix is bulbous, no CMT.  Thin prep pap is done with HR HPV cotesting. Uterus is felt to be normal size, shape, and  contour.  No adnexal masses or tenderness noted. Extremities:  No swelling or varicosities noted Psych:  She has a normal mood and affect  Assessment:   Healthy well-woman exam OCP user  Plan:  Return in 2-3 weeks for liletta IUD insertion with next menses F/U 1 year for AE, or sooner if needed  Caela Huot Rockney Ghee, CNM

## 2016-09-17 LAB — COMPREHENSIVE METABOLIC PANEL
ALT: 14 IU/L (ref 0–32)
AST: 21 IU/L (ref 0–40)
Albumin/Globulin Ratio: 2.2 (ref 1.2–2.2)
Albumin: 4.3 g/dL (ref 3.5–5.5)
Alkaline Phosphatase: 33 IU/L — ABNORMAL LOW (ref 39–117)
BUN/Creatinine Ratio: 18 (ref 9–23)
BUN: 13 mg/dL (ref 6–20)
Bilirubin Total: 0.6 mg/dL (ref 0.0–1.2)
CHLORIDE: 104 mmol/L (ref 96–106)
CO2: 24 mmol/L (ref 20–29)
Calcium: 9.4 mg/dL (ref 8.7–10.2)
Creatinine, Ser: 0.73 mg/dL (ref 0.57–1.00)
GFR, EST AFRICAN AMERICAN: 127 mL/min/{1.73_m2} (ref >59)
GFR, EST NON AFRICAN AMERICAN: 110 mL/min/{1.73_m2} (ref >59)
GLUCOSE: 39 mg/dL — AB (ref 65–99)
Globulin, Total: 2 g/dL (ref 1.5–4.5)
Potassium: 4.1 mmol/L (ref 3.5–5.2)
Sodium: 142 mmol/L (ref 134–144)
TOTAL PROTEIN: 6.3 g/dL (ref 6.0–8.5)

## 2016-09-17 LAB — LIPID PANEL
CHOL/HDL RATIO: 2.5 ratio (ref 0.0–4.4)
Cholesterol, Total: 167 mg/dL (ref 100–199)
HDL: 68 mg/dL (ref >39)
LDL Calculated: 84 mg/dL (ref 0–99)
Triglycerides: 77 mg/dL (ref 0–149)
VLDL CHOLESTEROL CAL: 15 mg/dL (ref 5–40)

## 2016-09-23 LAB — CYTOLOGY - PAP

## 2016-10-07 ENCOUNTER — Ambulatory Visit: Payer: Self-pay | Admitting: Obstetrics and Gynecology

## 2017-05-31 ENCOUNTER — Other Ambulatory Visit: Payer: Self-pay | Admitting: *Deleted

## 2017-05-31 ENCOUNTER — Telehealth: Payer: Self-pay | Admitting: Obstetrics and Gynecology

## 2017-05-31 MED ORDER — NORGESTIMATE-ETH ESTRADIOL 0.25-35 MG-MCG PO TABS: 1.0000 | ORAL_TABLET | Freq: Every day | ORAL | 4 refills | Status: DC

## 2017-05-31 NOTE — Telephone Encounter (Signed)
The patient called and stated that she would like to speak with Amy in regards to her moving this week and misplacing her B.C, The patient stated that she would like to get a refill today she has missed two pills so far. Please advise.

## 2017-05-31 NOTE — Telephone Encounter (Signed)
Called pt sent rx to [facility]

## 2017-06-09 IMAGING — US US OB LIMITED
1 series · 14 of 23 positions shown · non-contrast
Comparison: none

CLINICAL DATA: Vaginal bleeding for 1 day

EXAM:
LIMITED OBSTETRIC ULTRASOUND

[Series 1: us ob limited · 0.26mm/px · 14 of 23 slices shown]
[im 1/23]
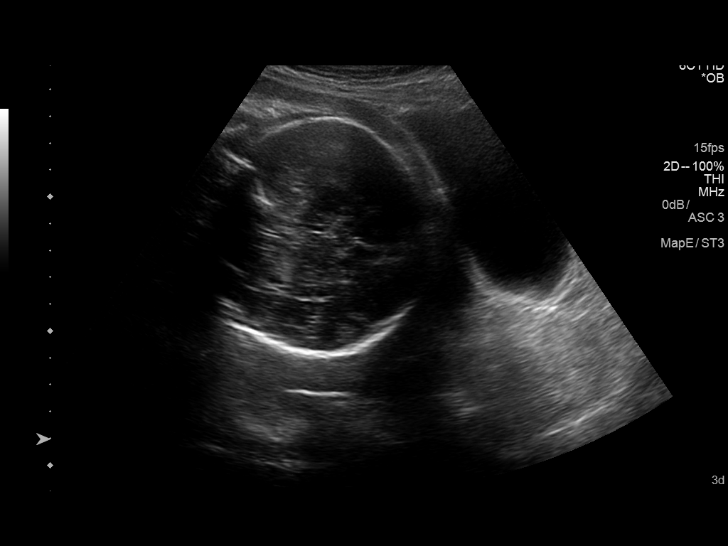
[im 3/23]
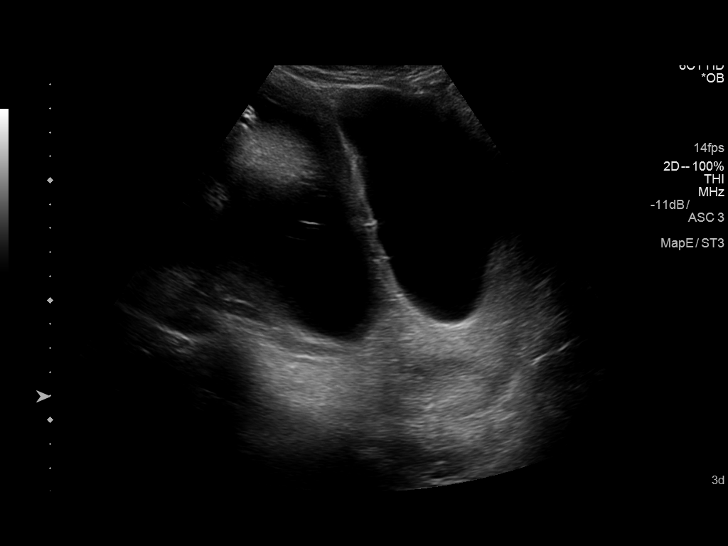
[im 5/23]
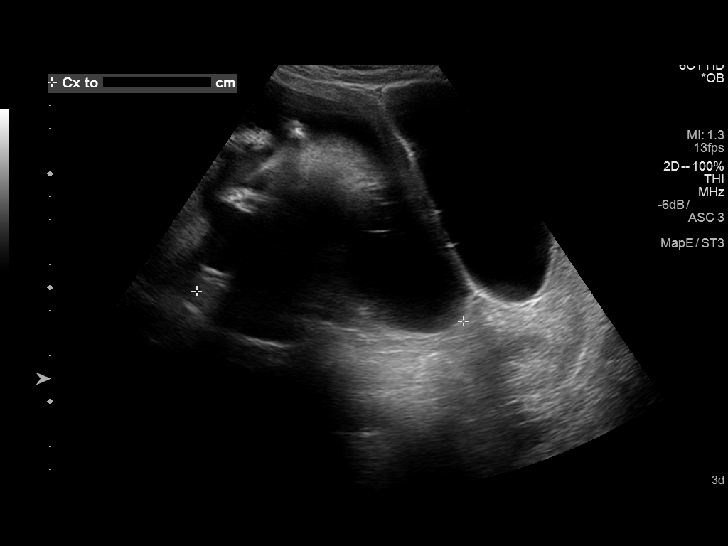
[im 6/23]
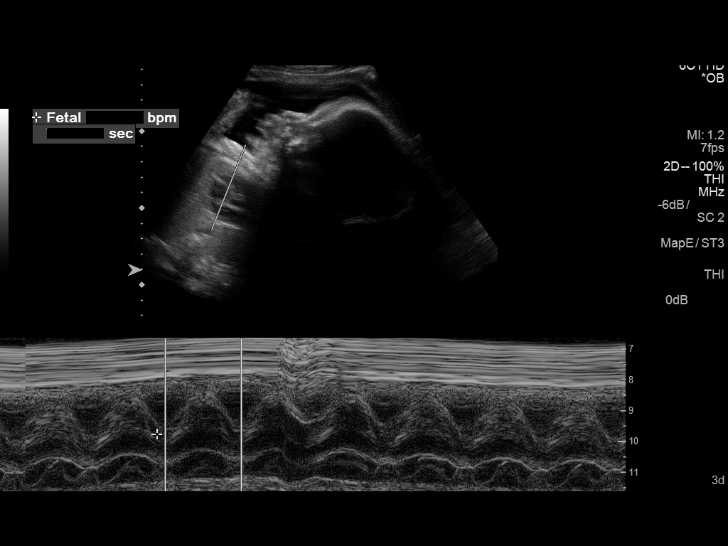
[im 8/23]
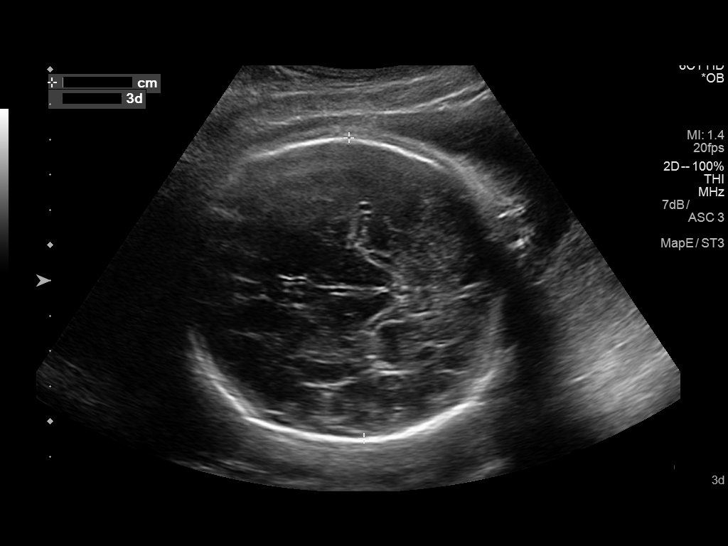
[im 10/23]
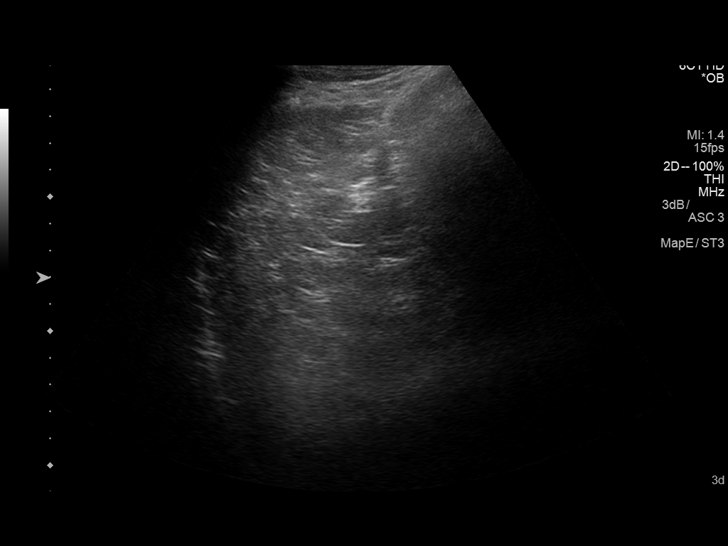
[im 11/23]
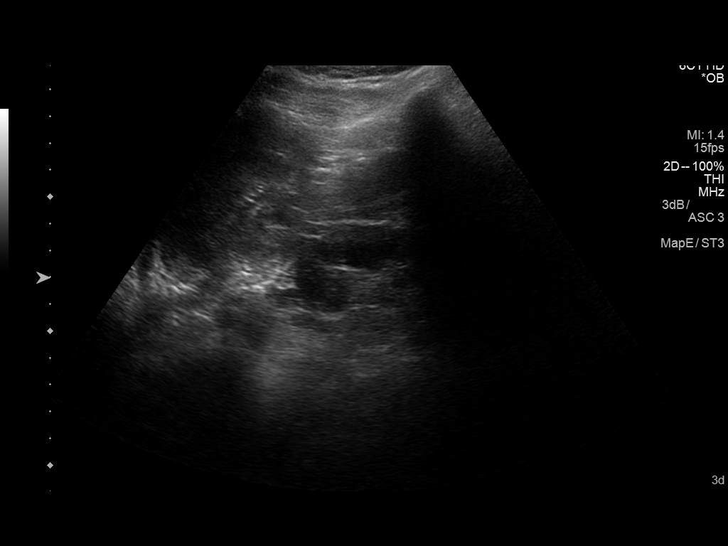
[im 13/23]
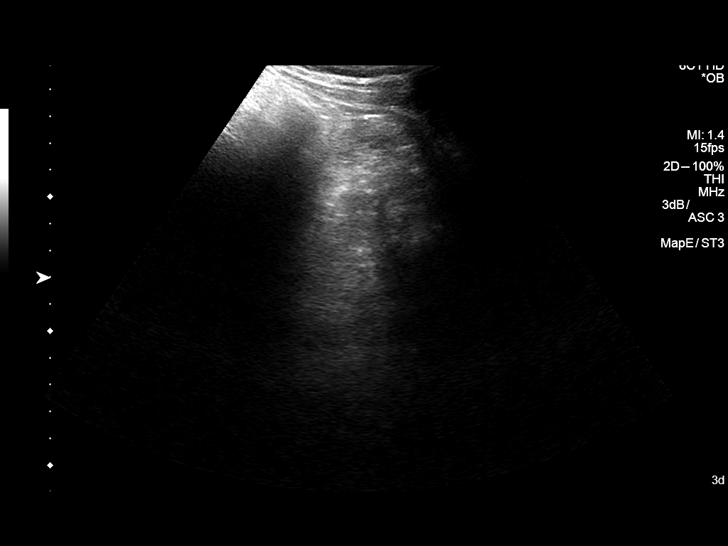
[im 14/23]
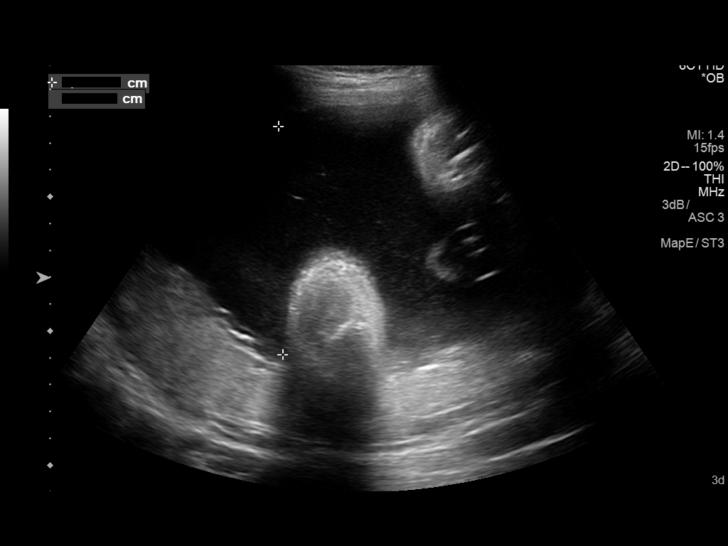
[im 16/23]
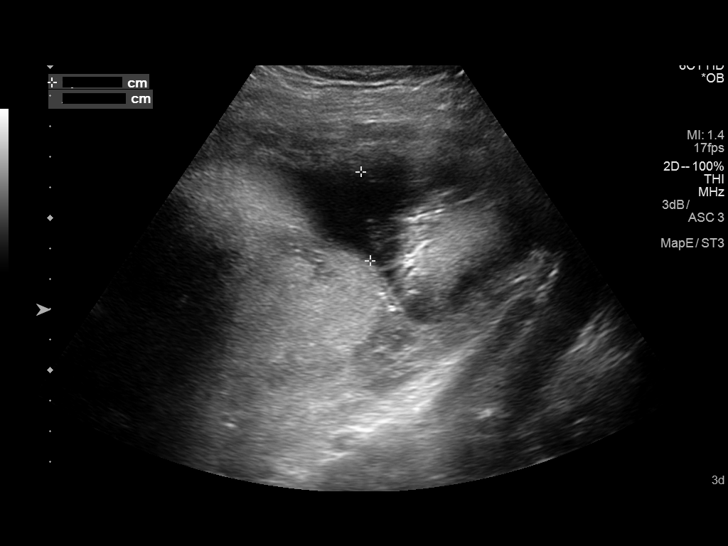
[im 18/23]
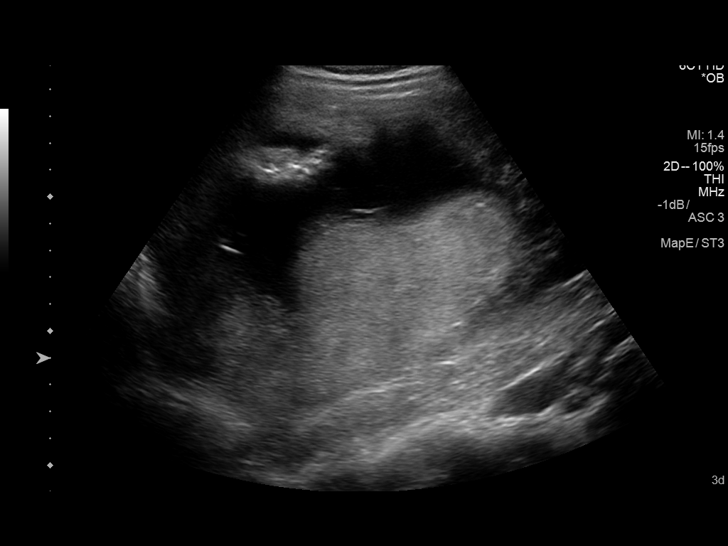
[im 19/23]
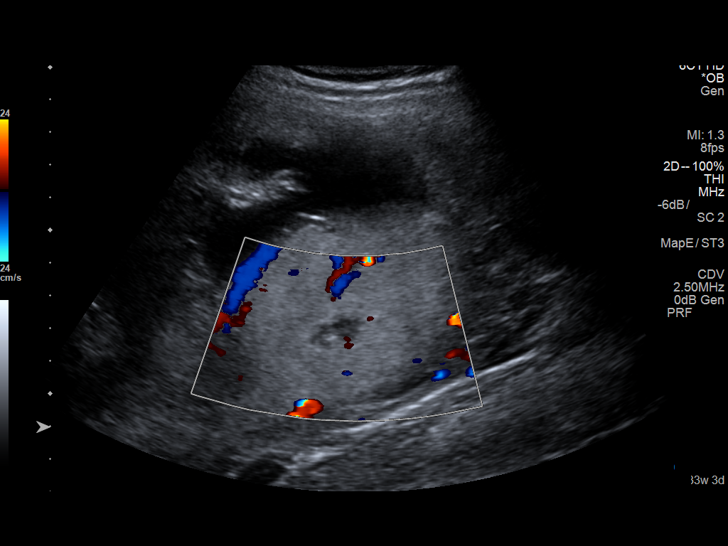
[im 21/23]
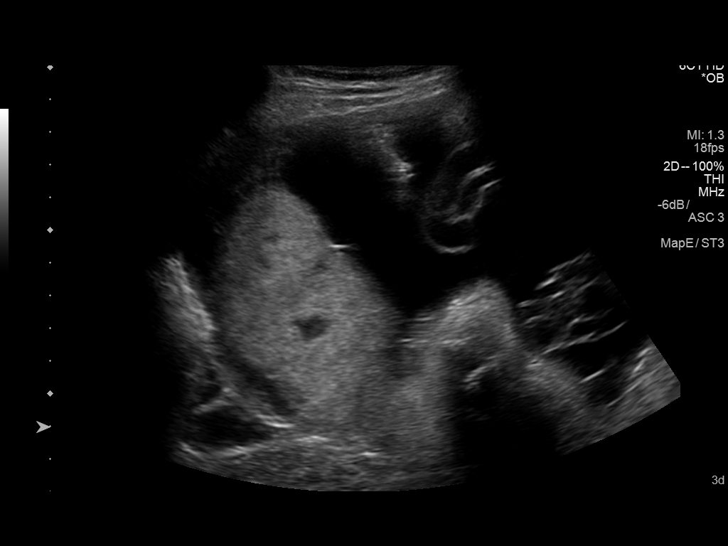
[im 23/23]
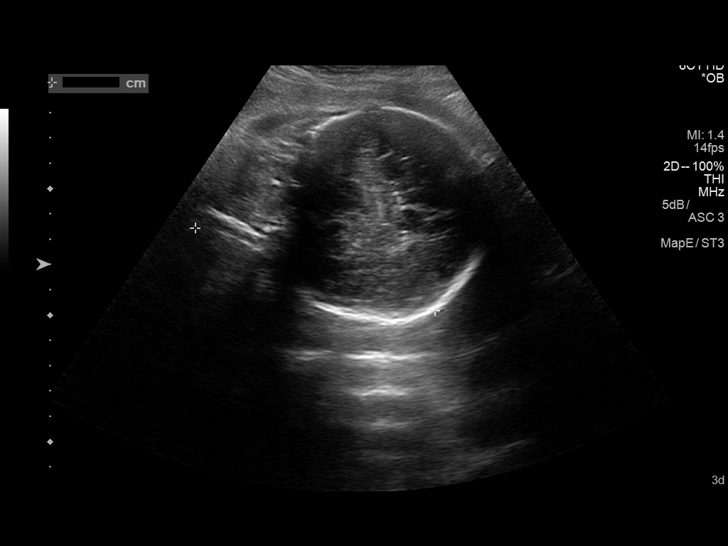

[14 of 23 positions shown; findings below may reference images not displayed]

FINDINGS: Number of Fetuses: 1

Heart Rate:  135 bpm

Movement: Yes

Presentation: Cephalic

Placental Location: Posterior

Previa: No

No placental abruption seen. There is a small placental Lake in the
mid placenta measuring 1.6 x 0.9 x 0.9 cm.

Amniotic Fluid (Subjective):  Within normal limits.

BPD:  8.6cm 34w  3d

MATERNAL FINDINGS:

Cervix:  Appears closed.

Uterus/Adnexae:  No abnormality visualized.
IMPRESSION: Single live intrauterine gestation with estimated gestational age of
34+ weeks based on biparietal diameter. No placental abruption or
previa. Cervical os closed. Amniotic fluid volume within normal
limits for gestational age. Distance from cervix to placenta is
cm. Small placental lake noted.

This exam is performed on an emergent basis to address a specific
clinical concern and does not comprehensively evaluate fetal size,
dating, or anatomy; follow-up complete OB US should be considered if
further fetal assessment is warranted.

## 2017-08-18 ENCOUNTER — Telehealth: Payer: Self-pay | Admitting: Obstetrics and Gynecology

## 2017-08-18 NOTE — Telephone Encounter (Signed)
Patient called requesting a refill on birth control. She uses the [facility] on [address]. Thanks

## 2017-08-19 ENCOUNTER — Other Ambulatory Visit: Payer: Self-pay | Admitting: *Deleted

## 2017-08-19 MED ORDER — NORGESTIMATE-ETH ESTRADIOL 0.25-35 MG-MCG PO TABS: 1.0000 | ORAL_TABLET | Freq: Every day | ORAL | 2 refills | Status: DC

## 2017-08-19 NOTE — Telephone Encounter (Signed)
Done-ac 

## 2017-08-23 ENCOUNTER — Ambulatory Visit: Payer: Self-pay | Admitting: Nurse Practitioner

## 2017-09-30 ENCOUNTER — Encounter: Payer: Self-pay | Admitting: Obstetrics and Gynecology

## 2017-10-14 ENCOUNTER — Telehealth: Payer: Self-pay | Admitting: Obstetrics and Gynecology

## 2018-02-10 ENCOUNTER — Ambulatory Visit (INDEPENDENT_AMBULATORY_CARE_PROVIDER_SITE_OTHER): Payer: Self-pay | Admitting: Obstetrics and Gynecology

## 2018-02-10 ENCOUNTER — Encounter: Payer: Self-pay | Admitting: Obstetrics and Gynecology

## 2018-02-10 VITALS — BP 115/76 | HR 72 | Ht 63.0 in | Wt 124.7 lb

## 2018-02-10 DIAGNOSIS — Z01419 Encounter for gynecological examination (general) (routine) without abnormal findings: Secondary | ICD-10-CM

## 2018-02-10 DIAGNOSIS — Z23 Encounter for immunization: Secondary | ICD-10-CM

## 2018-02-10 MED ORDER — NORGESTIMATE-ETH ESTRADIOL 0.25-35 MG-MCG PO TABS: 1.0000 | ORAL_TABLET | Freq: Every day | ORAL | 2 refills | Status: DC

## 2018-02-10 NOTE — Patient Instructions (Addendum)
Preventive Care 18-39 Years, Female Preventive care refers to lifestyle choices and visits with your health care provider that can promote health and wellness. What does preventive care include?  A yearly physical exam. This is also called an annual well check.  Dental exams once or twice a year.  Routine eye exams. Ask your health care provider how often you should have your eyes checked.  Personal lifestyle choices, including: ? Daily care of your teeth and gums. ? Regular physical activity. ? Eating a healthy diet. ? Avoiding tobacco and drug use. ? Limiting alcohol use. ? Practicing safe sex. ? Taking vitamin and mineral supplements as recommended by your health care provider. What happens during an annual well check? The services and screenings done by your health care provider during your annual well check will depend on your age, overall health, lifestyle risk factors, and family history of disease. Counseling Your health care provider may ask you questions about your:  Alcohol use.  Tobacco use.  Drug use.  Emotional well-being.  Home and relationship well-being.  Sexual activity.  Eating habits.  Work and work Statistician.  Method of birth control.  Menstrual cycle.  Pregnancy history.  Screening You may have the following tests or measurements:  Height, weight, and BMI.  Diabetes screening. This is done by checking your blood sugar (glucose) after you have not eaten for a while (fasting).  Blood pressure.  Lipid and cholesterol levels. These may be checked every 5 years starting at age 38.  Skin check.  Hepatitis C blood test.  Hepatitis B blood test.  Sexually transmitted disease (STD) testing.  BRCA-related cancer screening. This may be done if you have a family history of breast, ovarian, tubal, or peritoneal cancers.  Pelvic exam and Pap test. This may be done every 3 years starting at age 38. Starting at age 30, this may be done  every 5 years if you have a Pap test in combination with an HPV test.  Discuss your test results, treatment options, and if necessary, the need for more tests with your health care provider. Vaccines Your health care provider may recommend certain vaccines, such as:  Influenza vaccine. This is recommended every year.  Tetanus, diphtheria, and acellular pertussis (Tdap, Td) vaccine. You may need a Td booster every 10 years.  Varicella vaccine. You may need this if you have not been vaccinated.  HPV vaccine. If you are 39 or younger, you may need three doses over 6 months.  Measles, mumps, and rubella (MMR) vaccine. You may need at least one dose of MMR. You may also need a second dose.  Pneumococcal 13-valent conjugate (PCV13) vaccine. You may need this if you have certain conditions and were not previously vaccinated.  Pneumococcal polysaccharide (PPSV23) vaccine. You may need one or two doses if you smoke cigarettes or if you have certain conditions.  Meningococcal vaccine. One dose is recommended if you are age 68-21 years and a first-year college student living in a residence hall, or if you have one of several medical conditions. You may also need additional booster doses.  Hepatitis A vaccine. You may need this if you have certain conditions or if you travel or work in places where you may be exposed to hepatitis A.  Hepatitis B vaccine. You may need this if you have certain conditions or if you travel or work in places where you may be exposed to hepatitis B.  Haemophilus influenzae type b (Hib) vaccine. You may need this  if you have certain risk factors.  Talk to your health care provider about which screenings and vaccines you need and how often you need them. This information is not intended to replace advice given to you by your health care provider. Make sure you discuss any questions you have with your health care provider. Document Released: 05/05/2001 Document Revised:  11/27/2015 Document Reviewed: 01/08/2015 Elsevier Interactive Patient Education  2018 [employer].    Laparoscopic Tubal Ligation, Care After Refer to this sheet in the next few weeks. These instructions provide you with information about caring for yourself after your procedure. Your health care provider may also give you more specific instructions. Your treatment has been planned according to current medical practices, but problems sometimes occur. Call your health care provider if you have any problems or questions after your procedure. What can I expect after the procedure? After the procedure, it is common to have:  A sore throat.  Discomfort in your shoulder.  Mild discomfort or cramping in your abdomen.  Gas pains.  Pain or soreness in the area where the surgical cut (incision) was made.  A bloated feeling.  Tiredness.  Nausea.  Vomiting.  Follow these instructions at home: Medicines  Take over-the-counter and prescription medicines only as told by your health care provider.  Do not take aspirin because it can cause bleeding.  Do not drive or operate heavy machinery while taking prescription pain medicine. Activity  Rest for the rest of the day.  Return to your normal activities as told by your health care provider. Ask your health care provider what activities are safe for you. Incision care   Follow instructions from your health care provider about how to take care of your incision. Make sure you: ? Wash your hands with soap and water before you change your bandage (dressing). If soap and water are not available, use hand sanitizer. ? Change your dressing as told by your health care provider. ? Leave stitches (sutures) in place. They may need to stay in place for 2 weeks or longer.  Check your incision area every day for signs of infection. Check for: ? More redness, swelling, or pain. ? More fluid or blood. ? Warmth. ? Pus or a bad smell. Other  Instructions  Do not take baths, swim, or use a hot tub until your health care provider approves. You may take showers.  Keep all follow-up visits as told by your health care provider. This is important.  Have someone help you with your daily household tasks for the first few days. Contact a health care provider if:  You have more redness, swelling, or pain around your incision.  Your incision feels warm to the touch.  You have pus or a bad smell coming from your incision.  The edges of your incision break open after the sutures have been removed.  Your pain does not improve after 2-3 days.  You have a rash.  You repeatedly become dizzy or light-headed.  Your pain medicine is not helping.  You are constipated. Get help right away if:  You have a fever.  You faint.  You have increasing pain in your abdomen.  You have severe pain in one or both of your shoulders.  You have fluid or blood coming from your sutures or from your vagina.  You have shortness of breath or difficulty breathing.  You have chest pain or leg pain.  You have ongoing nausea, vomiting, or diarrhea. This information is not intended  to replace advice given to you by your health care provider. Make sure you discuss any questions you have with your health care provider. Document Released: 09/26/2004 Document Revised: 08/12/2015 Document Reviewed: 02/17/2015 Elsevier Interactive Patient Education  [employer].

## 2018-02-10 NOTE — Progress Notes (Signed)
Subjective:   Brittany Olson is a 32 y.o. [id number] Caucasian female here for a routine well-woman exam.  Patient's last menstrual period was 01/04/2018.    Current complaints: none, still desires either BTL or IUD if insurance will cover PCP: me       doesn't desire labs  Social History: Sexual: heterosexual Marital Status: married Living situation: with family Occupation: hairdresser Tobacco/alcohol: no tobacco use Illicit drugs: no history of illicit drug use  The following portions of the patient's history were reviewed and updated as appropriate: allergies, current medications, past family history, past medical history, past social history, past surgical history and problem list.  Past Medical History Past Medical History:  Diagnosis Date  . Abnormal uterine bleeding (AUB)    history  . H/O miscarriage, currently pregnant    X2  . Hallux valgus    ACQUIRED  . Ovarian cyst    HISTORY  . Perineal laceration during delivery, unspecified, delivered    FIRST DEGREE    Past Surgical History Past Surgical History:  Procedure Laterality Date  . NO PAST SURGERIES      Gynecologic History [id number]  Patient's last menstrual period was 01/04/2018. Contraception: OCP (estrogen/progesterone) Last Pap: 08/2016. Results were: normal   Obstetric History OB History  Gravida Para Term Preterm AB Living  5 2   1 2 2   SAB TAB Ectopic Multiple Live Births  2       2    # Outcome Date GA Lbr Len/2nd Weight Sex Delivery Anes PTL Lv  5 Preterm 06/06/09 [redacted]w[redacted]d  8 lb (3.629 kg) F Vag-Spont  Y LIV  4 SAB 02/2009 [redacted]w[redacted]d       FD     Complications: H/O dilation and curettage  3 SAB 06/2008 [redacted]w[redacted]d       FD  2 Para 09/16/05 [redacted]w[redacted]d  8 lb 12 oz (3.969 kg) F Vag-Spont   LIV  1 Gravida             Current Medications Current Outpatient Medications on File Prior to Visit  Medication Sig Dispense Refill  . norgestimate-ethinyl estradiol (ORTHO-CYCLEN,SPRINTEC,PREVIFEM) 0.25-35 MG-MCG tablet  Take 1 tablet by mouth daily. 3 Package 2  . Cholecalciferol (VITAMIN D3) 5000 units CAPS Take 1 capsule (5,000 Units total) by mouth daily. (Patient not taking: Reported on 09/16/2016) 120 capsule 2  . Prenatal Vit-Fe Fumarate-FA (PRENATAL MULTIVITAMIN) TABS tablet Take 1 tablet by mouth daily at 12 noon.     No current facility-administered medications on file prior to visit.     Review of Systems Patient denies any headaches, blurred vision, shortness of breath, chest pain, abdominal pain, problems with bowel movements, urination, or intercourse.  Objective:  BP 115/76   Pulse 72   Ht 5\' 3"  (1.6 m)   Wt 124 lb 11.2 oz (56.6 kg)   LMP 01/04/2018   BMI 22.09 kg/m  Physical Exam  General:  Well developed, well nourished, no acute distress. She is alert and oriented x3. Skin:  Warm and dry Neck:  Midline trachea, no thyromegaly or nodules Cardiovascular: Regular rate and rhythm, no murmur heard Lungs:  Effort normal, all lung fields clear to auscultation bilaterally Breasts:  No dominant palpable mass, retraction, or nipple discharge Abdomen:  Soft, non tender, no hepatosplenomegaly or masses Pelvic:  External genitalia is normal in appearance.  The vagina is normal in appearance. The cervix is bulbous, no CMT.  Thin prep pap is not done . Uterus is felt to be normal  size, shape, and contour.  No adnexal masses or tenderness noted.  Extremities:  No swelling or varicosities noted Psych:  She has a normal mood and affect  Assessment:   Healthy well-woman exam Desires permanent sterilization or IUD Needs flu vaccine Flu vaccine given  Plan:  Will check for coverage for BTL, and to see DR Marcelline Mates F/U 1 year for AE, or sooner if needed   Melody Rockney Ghee, CNM

## 2018-11-04 ENCOUNTER — Ambulatory Visit: Payer: Self-pay | Admitting: Obstetrics and Gynecology

## 2018-11-04 ENCOUNTER — Other Ambulatory Visit: Payer: Self-pay

## 2018-11-04 ENCOUNTER — Encounter: Payer: Self-pay | Admitting: Obstetrics and Gynecology

## 2018-11-04 VITALS — BP 115/71 | HR 75 | Ht 63.0 in | Wt 127.7 lb

## 2018-11-04 DIAGNOSIS — F419 Anxiety disorder, unspecified: Secondary | ICD-10-CM

## 2018-11-04 MED ORDER — ALPRAZOLAM 0.5 MG PO TABS: 0.5000 mg | ORAL_TABLET | Freq: Three times a day (TID) | ORAL | 1 refills | Status: DC | PRN

## 2018-11-04 NOTE — Progress Notes (Signed)
  Subjective:     Patient ID: Brittany Olson, female   DOB: [birth year], 33 y.o.   MRN: [id number]  HPI Has been seeing Dr Nicolasa Ducking since January and was started on zoloft it helped, but she stopped it in July due to fatigue and decreased sex drive  that was associated with it.  Feels better except having occasionally feeling anxious. Xanax has helped in the past. Desires refill. States previous prescription of 45 tablets lasted her 3 years.   GAD 7 : Generalized Anxiety Score 11/04/2018  Nervous, Anxious, on Edge 2  Control/stop worrying 1  Worry too much - different things 2  Trouble relaxing 2  Restless 2  Easily annoyed or irritable 3  Afraid - awful might happen 1  Total GAD 7 Score 13  Anxiety Difficulty Very difficult    Review of Systems  Genitourinary: Negative for vaginal bleeding.  All other systems reviewed and are negative.      Objective:   Physical Exam A&O x4 Well groomed female in no distress Blood pressure 115/71, pulse 75, height 5\' 3"  (1.6 m), weight 127 lb 11.2 oz (57.9 kg), last menstrual period 11/01/2018,   Body mass index is 22.62 kg/m.  PE not indicated    Assessment:     anxiety    Plan:     Counseled at length regarding xanax use as a rescue medication. She will let me know if anxiety becomes daily and she is having to use xanax daily. Will RTC in 3 months for planned annual exam or sooner if needed.   Brittany Olson,CNM

## 2018-11-04 NOTE — Patient Instructions (Signed)

## 2019-01-03 ENCOUNTER — Telehealth: Payer: Self-pay | Admitting: Obstetrics and Gynecology

## 2019-01-03 ENCOUNTER — Other Ambulatory Visit: Payer: Self-pay | Admitting: Obstetrics and Gynecology

## 2019-01-03 MED ORDER — NORGESTIMATE-ETH ESTRADIOL 0.25-35 MG-MCG PO TABS: 1.0000 | ORAL_TABLET | Freq: Every day | ORAL | 1 refills | Status: DC

## 2019-01-03 NOTE — Telephone Encounter (Signed)
Patient called requesting a refill on sprintec sent to [facility] on [address]. Thanks

## 2019-01-03 NOTE — Telephone Encounter (Signed)
LM on patients voicemail that prescription has been sent in and the she is due for her physical at the end of November. Left number for her to call back and schedule.

## 2019-03-22 ENCOUNTER — Telehealth: Payer: Self-pay | Admitting: Certified Nurse Midwife

## 2019-03-22 MED ORDER — NORGESTIMATE-ETH ESTRADIOL 0.25-35 MG-MCG PO TABS: 1.0000 | ORAL_TABLET | Freq: Every day | ORAL | 0 refills | Status: DC

## 2019-03-22 NOTE — Telephone Encounter (Signed)
Sent in refill and schedule patient for physical.

## 2019-03-22 NOTE — Telephone Encounter (Signed)
Pt called in for a refill for norgestimate-ethinyl estradiol (ORTHO-CYCLEN) 0.25-35 MG-MCG tablet.  Looked in notes and you filled last time Please advise

## 2019-05-08 ENCOUNTER — Encounter: Payer: Self-pay | Admitting: Certified Nurse Midwife

## 2019-06-19 ENCOUNTER — Telehealth: Payer: Self-pay | Admitting: Certified Nurse Midwife

## 2019-06-19 ENCOUNTER — Other Ambulatory Visit: Payer: Self-pay

## 2019-06-19 MED ORDER — NORGESTIMATE-ETH ESTRADIOL 0.25-35 MG-MCG PO TABS: 1.0000 | ORAL_TABLET | Freq: Every day | ORAL | 0 refills | Status: DC

## 2019-06-19 NOTE — Telephone Encounter (Signed)
Pt called in and stated that she has an appt 4/26. The pt is running low on [facility] and is requesting a refill sent to [facility] on [address]. Please advise

## 2019-06-19 NOTE — Telephone Encounter (Signed)
OCP refill sent per patient request

## 2019-07-17 ENCOUNTER — Other Ambulatory Visit: Payer: Self-pay

## 2019-07-17 ENCOUNTER — Encounter: Payer: Self-pay | Admitting: Certified Nurse Midwife

## 2019-07-17 ENCOUNTER — Ambulatory Visit: Payer: Self-pay | Admitting: Certified Nurse Midwife

## 2019-07-17 VITALS — BP 109/75 | HR 65 | Ht 63.0 in | Wt 132.3 lb

## 2019-07-17 DIAGNOSIS — Z803 Family history of malignant neoplasm of breast: Secondary | ICD-10-CM

## 2019-07-17 DIAGNOSIS — N6331 Unspecified lump in axillary tail of the right breast: Secondary | ICD-10-CM

## 2019-07-17 DIAGNOSIS — E049 Nontoxic goiter, unspecified: Secondary | ICD-10-CM

## 2019-07-17 DIAGNOSIS — Z01419 Encounter for gynecological examination (general) (routine) without abnormal findings: Secondary | ICD-10-CM

## 2019-07-17 MED ORDER — NORGESTIMATE-ETH ESTRADIOL 0.25-35 MG-MCG PO TABS: 1.0000 | ORAL_TABLET | Freq: Every day | ORAL | 11 refills | Status: DC

## 2019-07-17 NOTE — Progress Notes (Signed)
GYNECOLOGY ANNUAL PREVENTATIVE CARE ENCOUNTER NOTE  History:     Brittany Olson is a 34 y.o. [id number] female here for a routine annual gynecologic exam.  Current complaints: none.   Denies abnormal vaginal bleeding, discharge, pelvic pain, problems with intercourse or other gynecologic concerns.     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 Contraception: OCP (estrogen/progesterone) Last Pap: 09/16/2016. Results were: normal with negative HPV Last mammogram: 05/05/13. Results were: has history of pea size mass on left axilla. Order placed today . Pt has family history breast cancer ( sister recently finished chemo).   Obstetric History OB History  Gravida Para Term Preterm AB Living  5 2   1 2 2   SAB TAB Ectopic Multiple Live Births  2       2    # Outcome Date GA Lbr Len/2nd Weight Sex Delivery Anes PTL Lv  5 Preterm 06/06/09 [redacted]w[redacted]d  8 lb (3.629 kg) F Vag-Spont  Y LIV  4 SAB 02/2009 [redacted]w[redacted]d       FD     Complications: H/O dilation and curettage  3 SAB 06/2008 [redacted]w[redacted]d       FD  2 Para 09/16/05 [redacted]w[redacted]d  8 lb 12 oz (3.969 kg) F Vag-Spont   LIV  1 Gravida             Past Medical History:  Diagnosis Date  . Abnormal uterine bleeding (AUB)    history  . H/O miscarriage, currently pregnant    X2  . Hallux valgus    ACQUIRED  . Ovarian cyst    HISTORY  . Perineal laceration during delivery, unspecified, delivered    FIRST DEGREE    Past Surgical History:  Procedure Laterality Date  . NO PAST SURGERIES      Current Outpatient Medications on File Prior to Visit  Medication Sig Dispense Refill  . ALPRAZolam (XANAX) 0.5 MG tablet Take 1 tablet (0.5 mg total) by mouth 3 (three) times daily as  needed for anxiety. 45 tablet 1  . ibuprofen (ADVIL) 200 MG tablet Take by mouth.    . norgestimate-ethinyl estradiol (ORTHO-CYCLEN) 0.25-35 MG-MCG tablet Take 1 tablet by mouth daily. 1 Package 0  . Cholecalciferol (VITAMIN D3) 5000 units CAPS Take 1 capsule (5,000 Units total) by mouth daily. (Patient not taking: Reported on 09/16/2016) 120 capsule 2  . Prenatal Vit-Fe Fumarate-FA (PRENATAL MULTIVITAMIN) TABS tablet Take 1 tablet by mouth daily at 12 noon.     No current facility-administered medications on file prior to visit.    No Known Allergies  Social History:  reports that she has never smoked. She has never used smokeless tobacco. She reports that she does not drink alcohol or use drugs.  Family History  Problem Relation Age of Onset  . Prostate cancer Father   . Heart attack Paternal Grandmother   . Heart attack Maternal Grandfather     The following portions of the patient's history were reviewed and updated as appropriate: allergies, current medications, past family history, past medical history, past social history, past surgical history and problem list.  Review of Systems Pertinent items noted in HPI and remainder of comprehensive ROS otherwise negative.  Physical Exam:  BP 109/75   Pulse 65   Ht 5\' 3"  (1.6 m)  Wt 132 lb 5 oz (60 kg)   LMP 07/11/2019 (Exact Date)   BMI 23.44 kg/m  CONSTITUTIONAL: Well-developed, well-nourished female in no acute distress.  HENT:  Normocephalic, atraumatic, External right and left ear normal. Oropharynx is clear and moist EYES: Conjunctivae and EOM are normal. Pupils are equal, round, and reactive to light. No scleral icterus.  NECK: Normal range of motion, supple, no masses.  Thyroid enlarged.  SKIN: Skin is warm and dry. No rash noted. Not diaphoretic. No erythema. No pallor. MUSCULOSKELETAL: Normal range of motion. No tenderness.  No cyanosis, clubbing, or edema.  2+ distal pulses. NEUROLOGIC: Alert and oriented to person,  place, and time. Normal reflexes, muscle tone coordination.  PSYCHIATRIC: Normal mood and affect. Normal behavior. Normal judgment and thought content. CARDIOVASCULAR: Normal heart rate noted, regular rhythm RESPIRATORY: Clear to auscultation bilaterally. Effort and breath sounds normal, no problems with respiration noted. BREASTS: Bilateral breast implants. Symmetric in size.no  tenderness, skin changes, 11 o clock, mobile, known mass on left breast -pea size , no change in size mobile. No nipple drainage, or lymphadenopathy bilaterally. Performed in the presence of a chaperone. Mass on right breast pea size ABDOMEN: Soft, no distention noted.  No tenderness, rebound or guarding.  PELVIC: Normal appearing external genitalia and urethral meatus; normal appearing vaginal mucosa and cervix.  No abnormal discharge noted.  Pap smear not indicated.  Normal uterine size, no other palpable masses, no uterine or adnexal tenderness.  Performed in the presence of a chaperone.   Assessment and Plan:    1. Women's annual routine gynecological examination  Pap smear: not due until 2023 Mammogram not indicated Labs:TSH and panel  Refills: OCP Referrals:Mammogram due to family hx, new mass noted left breast pea size.   Routine preventative health maintenance measures emphasized. Please refer to After Visit Summary for other counseling recommendations.      Philip Aspen, CNM

## 2019-07-17 NOTE — Patient Instructions (Signed)
Preventive Care 21-35 Years Old, Female Preventive care refers to visits with your health care provider and lifestyle choices that can promote health and wellness. This includes:  A yearly physical exam. This may also be called an annual well check.  Regular dental visits and eye exams.  Immunizations.  Screening for certain conditions.  Healthy lifestyle choices, such as eating a healthy diet, getting regular exercise, not using drugs or products that contain nicotine and tobacco, and limiting alcohol use. What can I expect for my preventive care visit? Physical exam Your health care provider will check your:  Height and weight. This may be used to calculate body mass index (BMI), which tells if you are at a healthy weight.  Heart rate and blood pressure.  Skin for abnormal spots. Counseling Your health care provider may ask you questions about your:  Alcohol, tobacco, and drug use.  Emotional well-being.  Home and relationship well-being.  Sexual activity.  Eating habits.  Work and work environment.  Method of birth control.  Menstrual cycle.  Pregnancy history. What immunizations do I need?  Influenza (flu) vaccine  This is recommended every year. Tetanus, diphtheria, and pertussis (Tdap) vaccine  You may need a Td booster every 10 years. Varicella (chickenpox) vaccine  You may need this if you have not been vaccinated. Human papillomavirus (HPV) vaccine  If recommended by your health care provider, you may need three doses over 6 months. Measles, mumps, and rubella (MMR) vaccine  You may need at least one dose of MMR. You may also need a second dose. Meningococcal conjugate (MenACWY) vaccine  One dose is recommended if you are age 19-21 years and a first-year college student living in a residence hall, or if you have one of several medical conditions. You may also need additional booster doses. Pneumococcal conjugate (PCV13) vaccine  You may need  this if you have certain conditions and were not previously vaccinated. Pneumococcal polysaccharide (PPSV23) vaccine  You may need one or two doses if you smoke cigarettes or if you have certain conditions. Hepatitis A vaccine  You may need this if you have certain conditions or if you travel or work in places where you may be exposed to hepatitis A. Hepatitis B vaccine  You may need this if you have certain conditions or if you travel or work in places where you may be exposed to hepatitis B. Haemophilus influenzae type b (Hib) vaccine  You may need this if you have certain conditions. You may receive vaccines as individual doses or as more than one vaccine together in one shot (combination vaccines). Talk with your health care provider about the risks and benefits of combination vaccines. What tests do I need?  Blood tests  Lipid and cholesterol levels. These may be checked every 5 years starting at age 20.  Hepatitis C test.  Hepatitis B test. Screening  Diabetes screening. This is done by checking your blood sugar (glucose) after you have not eaten for a while (fasting).  Sexually transmitted disease (STD) testing.  BRCA-related cancer screening. This may be done if you have a family history of breast, ovarian, tubal, or peritoneal cancers.  Pelvic exam and Pap test. This may be done every 3 years starting at age 21. Starting at age 30, this may be done every 5 years if you have a Pap test in combination with an HPV test. Talk with your health care provider about your test results, treatment options, and if necessary, the need for more tests.   Follow these instructions at home: Eating and drinking   Eat a diet that includes fresh fruits and vegetables, whole grains, lean protein, and low-fat dairy.  Take vitamin and mineral supplements as recommended by your health care provider.  Do not drink alcohol if: ? Your health care provider tells you not to drink. ? You are  pregnant, may be pregnant, or are planning to become pregnant.  If you drink alcohol: ? Limit how much you have to 0-1 drink a day. ? Be aware of how much alcohol is in your drink. In the [place] one drink equals one 12 oz bottle of beer (355 mL), one 5 oz glass of wine (148 mL), or one 1 oz glass of hard liquor (44 mL). Lifestyle  Take daily care of your teeth and gums.  Stay active. Exercise for at least 30 minutes on 5 or more days each week.  Do not use any products that contain nicotine or tobacco, such as cigarettes, e-cigarettes, and chewing tobacco. If you need help quitting, ask your health care provider.  If you are sexually active, practice safe sex. Use a condom or other form of birth control (contraception) in order to prevent pregnancy and STIs (sexually transmitted infections). If you plan to become pregnant, see your health care provider for a preconception visit. What's next?  Visit your health care provider once a year for a well check visit.  Ask your health care provider how often you should have your eyes and teeth checked.  Stay up to date on all vaccines. This information is not intended to replace advice given to you by your health care provider. Make sure you discuss any questions you have with your health care provider. Document Revised: 11/18/2017 Document Reviewed: 11/18/2017 Elsevier Patient Education  2020 [employer].

## 2019-07-18 ENCOUNTER — Other Ambulatory Visit: Payer: Self-pay | Admitting: Certified Nurse Midwife

## 2019-07-18 DIAGNOSIS — N6331 Unspecified lump in axillary tail of the right breast: Secondary | ICD-10-CM

## 2019-07-18 DIAGNOSIS — Z01419 Encounter for gynecological examination (general) (routine) without abnormal findings: Secondary | ICD-10-CM

## 2019-07-18 DIAGNOSIS — Z803 Family history of malignant neoplasm of breast: Secondary | ICD-10-CM

## 2019-07-18 DIAGNOSIS — N6310 Unspecified lump in the right breast, unspecified quadrant: Secondary | ICD-10-CM

## 2019-07-18 LAB — THYROID PANEL WITH TSH
Free Thyroxine Index: 1.5 (ref 1.2–4.9)
T3 Uptake Ratio: 21 % — ABNORMAL LOW (ref 24–39)
T4, Total: 7.1 ug/dL (ref 4.5–12.0)
TSH: 1.1 u[IU]/mL (ref 0.450–4.500)

## 2019-07-19 ENCOUNTER — Telehealth: Payer: Self-pay | Admitting: Certified Nurse Midwife

## 2019-07-19 ENCOUNTER — Telehealth: Payer: Self-pay

## 2019-07-19 NOTE — Telephone Encounter (Signed)
Patient viewed her lab results in her MyChart and is confused on what they mean. Could you please advise?  Thank you!

## 2019-07-19 NOTE — Telephone Encounter (Signed)
mychart message sent to patient

## 2019-07-26 ENCOUNTER — Ambulatory Visit
Admission: RE | Admit: 2019-07-26 | Discharge: 2019-07-26 | Disposition: A | Discharge status: Home / Self Care | Payer: Self-pay | Source: Ambulatory Visit | Attending: Certified Nurse Midwife | Admitting: Certified Nurse Midwife

## 2019-07-26 DIAGNOSIS — Z803 Family history of malignant neoplasm of breast: Secondary | ICD-10-CM

## 2019-07-26 DIAGNOSIS — N6310 Unspecified lump in the right breast, unspecified quadrant: Secondary | ICD-10-CM | POA: Insufficient documentation

## 2019-07-26 DIAGNOSIS — N6331 Unspecified lump in axillary tail of the right breast: Secondary | ICD-10-CM | POA: Insufficient documentation

## 2019-08-23 ENCOUNTER — Telehealth: Payer: Self-pay | Admitting: Certified Nurse Midwife

## 2019-08-23 NOTE — Telephone Encounter (Signed)
Patient called in saying she was never reached out to regarding her test results. Patient states that she is having more symptoms such as hair loss. Could you please advise?

## 2019-08-23 NOTE — Telephone Encounter (Signed)
mychart message sent to patient

## 2019-08-23 NOTE — Telephone Encounter (Signed)
Please let her know that her thyroid panel was normal.   Thanks  Deneise Lever

## 2019-12-05 ENCOUNTER — Telehealth: Payer: Self-pay

## 2019-12-05 NOTE — Telephone Encounter (Signed)
LMOM for office visit on 9/16

## 2019-12-07 ENCOUNTER — Other Ambulatory Visit: Payer: Self-pay

## 2019-12-07 ENCOUNTER — Ambulatory Visit: Payer: Self-pay | Admitting: Hospice and Palliative Medicine

## 2019-12-07 ENCOUNTER — Encounter: Payer: Self-pay | Admitting: Hospice and Palliative Medicine

## 2019-12-07 DIAGNOSIS — F909 Attention-deficit hyperactivity disorder, unspecified type: Secondary | ICD-10-CM

## 2019-12-07 DIAGNOSIS — Z79899 Other long term (current) drug therapy: Secondary | ICD-10-CM

## 2019-12-07 DIAGNOSIS — Z0001 Encounter for general adult medical examination with abnormal findings: Secondary | ICD-10-CM

## 2019-12-07 DIAGNOSIS — F411 Generalized anxiety disorder: Secondary | ICD-10-CM

## 2019-12-07 MED ORDER — ESCITALOPRAM OXALATE 5 MG PO TABS: 5.0000 mg | ORAL_TABLET | Freq: Every day | ORAL | 0 refills | Status: DC

## 2019-12-07 MED ORDER — AMPHETAMINE-DEXTROAMPHETAMINE 10 MG PO TABS: 20.0000 mg | ORAL_TABLET | Freq: Two times a day (BID) | ORAL | 0 refills | Status: DC

## 2019-12-07 NOTE — Progress Notes (Signed)
[facility] [place], [place] [address]  Internal MEDICINE  Office Visit Note  Patient Name: Brittany Olson  [id number]  [id number]  Date of Service: 12/09/2019   Complaints/HPI Pt is here for establishment of PCP. Chief Complaint  Patient presents with  . New Patient (Initial Visit)    discuss meds  . Quality Metric Gaps    pap, HepC   HPI Patient is here to establish care for PCP. She was a patient at our clinic previously, she comes back to get reestablished and to discuss medications. 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 In the last year she did establish care with a psychiatrist and therapist, she was briefly started on Zoloft but quickly stopped as she did not like the way she felt on this medication She wants to discuss today possibly starting back Adderall since she took over her business she is struggling with being in charge, she routinely finds herself unable to complete one task, moving on to others before completion, she finds herself letting things slip through her mind which causes her further stress because she wants to keep a successful business going She has had a yearly physical this year by her OB but she has not had blood work done in some time  Current Medication: Outpatient Encounter Medications as of 12/07/2019  Medication Sig  . ALPRAZolam (XANAX) 0.5 MG tablet Take 1 tablet (0.5 mg total) by mouth 3 (three) times daily as needed for anxiety.  Marland Kitchen ibuprofen (ADVIL) 200 MG tablet Take by mouth.  . norgestimate-ethinyl estradiol (ORTHO-CYCLEN) 0.25-35 MG-MCG tablet Take 1 tablet by  mouth daily.  Marland Kitchen amphetamine-dextroamphetamine (ADDERALL) 10 MG tablet Take 2 tablets (20 mg total) by mouth 2 (two) times daily.  Marland Kitchen escitalopram (LEXAPRO) 5 MG tablet Take 1 tablet (5 mg total) by mouth daily.   No facility-administered encounter medications on file as of 12/07/2019.    Surgical History: Past Surgical History:  Procedure Laterality Date  . AUGMENTATION MAMMAPLASTY Bilateral 2010   saline  . NO PAST SURGERIES      Medical History: Past Medical History:  Diagnosis Date  . Abnormal uterine bleeding (AUB)    history  . Anxiety   . H/O miscarriage, currently pregnant    X2  . Hallux valgus    ACQUIRED  . Ovarian cyst    HISTORY  . Perineal laceration during delivery, unspecified, delivered    FIRST DEGREE    Family History: Family History  Problem Relation Age of Onset  . Prostate cancer Father   . Heart attack Paternal Grandmother   . Heart attack Maternal Grandfather   . Breast cancer Sister 32    Social History   Socioeconomic History  . Marital status: Married    Spouse name: Not on file  . Number of children: Not on file  . Years of education: Not on file  . Highest education level: Not on file  Occupational History  . Occupation: HAIR DRESSER  Tobacco Use  . Smoking status: Never Smoker  . Smokeless tobacco: Never Used  Vaping Use  . Vaping Use: Never  used  Substance and Sexual Activity  . Alcohol use: Yes    Alcohol/week: 4.0 standard drinks    Types: 4 Shots of liquor per week    Comment: occ. drink at night to go to sleep  . Drug use: No  . Sexual activity: Yes    Partners: Male    Birth control/protection: Pill  Other Topics Concern  . Not on file  Social History Narrative  . Not on file   Social Determinants of Health   Financial Resource Strain:   . Difficulty of Paying Living Expenses: Not on file  Food Insecurity:   . Worried About Charity fundraiser in the Last Year: Not on file  . Ran Out of Food in the Last Year:  Not on file  Transportation Needs:   . Lack of Transportation (Medical): Not on file  . Lack of Transportation (Non-Medical): Not on file  Physical Activity:   . Days of Exercise per Week: Not on file  . Minutes of Exercise per Session: Not on file  Stress:   . Feeling of Stress : Not on file  Social Connections:   . Frequency of Communication with Friends and Family: Not on file  . Frequency of Social Gatherings with Friends and Family: Not on file  . Attends Religious Services: Not on file  . Active Member of Clubs or Organizations: Not on file  . Attends Archivist Meetings: Not on file  . Marital Status: Not on file  Intimate Partner Violence:   . Fear of Current or Ex-Partner: Not on file  . Emotionally Abused: Not on file  . Physically Abused: Not on file  . Sexually Abused: Not on file   Review of Systems  Constitutional: Negative for chills, diaphoresis and fatigue.  HENT: Negative for ear pain, postnasal drip and sinus pressure.   Eyes: Negative for photophobia, discharge, redness, itching and visual disturbance.  Respiratory: Negative for cough, shortness of breath and wheezing.   Cardiovascular: Negative for chest pain, palpitations and leg swelling.  Gastrointestinal: Negative for abdominal pain, constipation, diarrhea, nausea and vomiting.  Genitourinary: Negative for dysuria and flank pain.  Musculoskeletal: Negative for arthralgias, back pain, gait problem and neck pain.  Skin: Negative for color change.  Allergic/Immunologic: Negative for environmental allergies and food allergies.  Neurological: Negative for dizziness and headaches.  Hematological: Does not bruise/bleed easily.  Psychiatric/Behavioral: Negative for agitation, behavioral problems (depression) and hallucinations.    Vital Signs: BP 118/69   Pulse 71   Temp 98.3 F (36.8 C)   Resp 16   Ht 5\' 3"  (1.6 m)   Wt 136 lb 3.2 oz (61.8 kg)   SpO2 100%   BMI 24.13 kg/m    Physical  Exam Vitals reviewed.  Constitutional:      Appearance: Normal appearance. She is normal weight.  HENT:     Nose: Nose normal.     Mouth/Throat:     Mouth: Mucous membranes are moist.     Pharynx: Oropharynx is clear.  Cardiovascular:     Rate and Rhythm: Normal rate and regular rhythm.     Pulses: Normal pulses.     Heart sounds: Normal heart sounds.  Pulmonary:     Effort: Pulmonary effort is normal.     Breath sounds: Normal breath sounds.  Abdominal:     General: Abdomen is flat.     Palpations: Abdomen is soft.  Musculoskeletal:        General: Normal range of  motion.     Cervical back: Normal range of motion.  Skin:    General: Skin is warm.  Neurological:     General: No focal deficit present.     Mental Status: She is alert and oriented to person, place, and time. Mental status is at baseline.  Psychiatric:        Mood and Affect: Mood normal.        Behavior: Behavior normal.        Thought Content: Thought content normal.    Assessment/Plan: 1. Encounter for medication management in attention deficit hyperactivity disorder (ADHD) Was diagnosed with ADHD in the past and has been on and off Adderall since she was in her early 20's. We discussed in length that we will be working on managing her anxiety to see if this also helps improve her focus and attention. Our goal will be to have her on the lowest dose and lowest frequency of Adderall needed to help with improvement in focus and attention. Also advised that if she feels dosage needs further increasing we will need to refer back to psychiatry for further evaluation and management. - amphetamine-dextroamphetamine (ADDERALL) 10 MG tablet; Take 2 tablets (20 mg total) by mouth 2 (two) times daily.  Dispense: 60 tablet; Refill: 0  2. Generalized anxiety disorder Start on low dose Lexapro to assess for response to therapy, will reassess and increase dosing as appropriate. - escitalopram (LEXAPRO) 5 MG tablet; Take 1  tablet (5 mg total) by mouth daily.  Dispense: 90 tablet; Refill: 0  Labs ordered for upcoming CPE - Comprehensive Metabolic Panel (CMET) - CBC with Differential - Lipid Panel With LDL/HDL Ratio - Vitamin D 1,25 dihydroxy - B12  General Counseling: Yashira verbalizes understanding of the findings of todays visit and agrees with plan of treatment. I have discussed any further diagnostic evaluation that may be needed or ordered today. We also reviewed her medications today. she has been encouraged to call the office with any questions or concerns that should arise related to todays visit.  Orders Placed This Encounter  Procedures  . Comprehensive Metabolic Panel (CMET)  . CBC with Differential  . Lipid Panel With LDL/HDL Ratio  . Vitamin D 1,25 dihydroxy  . B12    Meds ordered this encounter  Medications  . escitalopram (LEXAPRO) 5 MG tablet    Sig: Take 1 tablet (5 mg total) by mouth daily.    Dispense:  90 tablet    Refill:  0  . amphetamine-dextroamphetamine (ADDERALL) 10 MG tablet    Sig: Take 2 tablets (20 mg total) by mouth 2 (two) times daily.    Dispense:  60 tablet    Refill:  0    Time spent: 35 Minutes Time spent includes review of chart, medications, test results and follow-up plan with the patient.  This patient was seen by Theodoro Grist AGNP-C in Collaboration with Dr Lavera Guise as a part of collaborative care agreement  Theodoro Grist [facility] Internal Medicine

## 2020-01-16 ENCOUNTER — Encounter: Payer: Self-pay | Admitting: Hospice and Palliative Medicine

## 2020-01-16 ENCOUNTER — Other Ambulatory Visit: Payer: Self-pay

## 2020-01-16 ENCOUNTER — Ambulatory Visit: Payer: Self-pay | Admitting: Hospice and Palliative Medicine

## 2020-01-16 DIAGNOSIS — F900 Attention-deficit hyperactivity disorder, predominantly inattentive type: Secondary | ICD-10-CM

## 2020-01-16 DIAGNOSIS — F411 Generalized anxiety disorder: Secondary | ICD-10-CM

## 2020-01-16 MED ORDER — AMPHETAMINE-DEXTROAMPHETAMINE 20 MG PO TABS: 20.0000 mg | ORAL_TABLET | Freq: Two times a day (BID) | ORAL | 0 refills | Status: DC

## 2020-01-16 NOTE — Progress Notes (Signed)
[facility] [place], [place] [address]  Internal MEDICINE  Office Visit Note  Patient Name: Brittany Olson  [id number]  [id number]  Date of Service: 01/18/2020  Chief Complaint  Patient presents with  . Anxiety  . ADHD  . controlled substance policy    reviewed    HPI Patient is here for routine follow-up She is being followed by anxiety as well as ADHD She was started on Lexapro at her last visit---she feels that her anxiety is much more controlled at this time She was also started on Adderall to help with focus and concentration, majority of the days she is only taking one dose and not requiring a second dose later in the day--she does take both doses on Monday as this is her late work day, she never takes her Adderall on Sunday as this is her day off She says since starting Adderall she has been much more efficient and has been able to get her work done  She has not been taking xanax for anxiety as she feels it has been well controlled She has not yet been to have her blood work done--moved form from her car and keeps forgetting    Current Medication: Outpatient Encounter Medications as of 01/16/2020  Medication Sig  . ALPRAZolam (XANAX) 0.5 MG tablet Take 1 tablet (0.5 mg total) by mouth 3 (three) times daily as needed for anxiety.  Marland Kitchen escitalopram (LEXAPRO) 5 MG tablet Take 1 tablet (5 mg total) by mouth daily.  Marland Kitchen ibuprofen (ADVIL) 200 MG tablet Take by mouth.  . norgestimate-ethinyl estradiol (ORTHO-CYCLEN) 0.25-35 MG-MCG tablet Take 1 tablet by mouth daily.  . [DISCONTINUED] amphetamine-dextroamphetamine (ADDERALL) 10 MG tablet Take 2 tablets (20 mg total) by mouth 2 (two) times daily.  Marland Kitchen amphetamine-dextroamphetamine (ADDERALL) 20 MG tablet Take 1 tablet (20 mg total) by mouth 2 (two) times daily.   No facility-administered encounter medications on file as of 01/16/2020.    Surgical History: Past Surgical History:  Procedure Laterality  Date  . AUGMENTATION MAMMAPLASTY Bilateral 2010   saline  . NO PAST SURGERIES      Medical History: Past Medical History:  Diagnosis Date  . Abnormal uterine bleeding (AUB)    history  . ADHD   . Anxiety   . H/O miscarriage, currently pregnant    X2  . Hallux valgus    ACQUIRED  . Ovarian cyst    HISTORY  . Perineal laceration during delivery, unspecified, delivered    FIRST DEGREE    Family History: Family History  Problem Relation Age of Onset  . Prostate cancer Father   . Heart attack Paternal Grandmother   . Heart attack Maternal Grandfather   . Breast cancer Sister 42    Social History   Socioeconomic History  . Marital status: Married    Spouse name: Not on file  . Number of children: Not on file  . Years of education: Not on file  . Highest education level: Not on file  Occupational History  . Occupation: HAIR DRESSER  Tobacco Use  . Smoking status: Never Smoker  . Smokeless tobacco: Never Used  Vaping Use  . Vaping Use: Never used  Substance and Sexual Activity  . Alcohol use: Yes    Alcohol/week: 4.0 standard drinks    Types: 4 Shots of liquor per week    Comment: occ. drink at night to go to sleep  . Drug use: No  . Sexual activity: Yes  Partners: Male    Birth control/protection: Pill  Other Topics Concern  . Not on file  Social History Narrative  . Not on file   Social Determinants of Health   Financial Resource Strain:   . Difficulty of Paying Living Expenses: Not on file  Food Insecurity:   . Worried About Charity fundraiser in the Last Year: Not on file  . Ran Out of Food in the Last Year: Not on file  Transportation Needs:   . Lack of Transportation (Medical): Not on file  . Lack of Transportation (Non-Medical): Not on file  Physical Activity:   . Days of Exercise per Week: Not on file  . Minutes of Exercise per Session: Not on file  Stress:   . Feeling of Stress : Not on file  Social Connections:   . Frequency of  Communication with Friends and Family: Not on file  . Frequency of Social Gatherings with Friends and Family: Not on file  . Attends Religious Services: Not on file  . Active Member of Clubs or Organizations: Not on file  . Attends Archivist Meetings: Not on file  . Marital Status: Not on file  Intimate Partner Violence:   . Fear of Current or Ex-Partner: Not on file  . Emotionally Abused: Not on file  . Physically Abused: Not on file  . Sexually Abused: Not on file   Review of Systems  Constitutional: Negative for chills, diaphoresis and fatigue.  HENT: Negative for ear pain, postnasal drip and sinus pressure.   Eyes: Negative for photophobia, discharge, redness, itching and visual disturbance.  Respiratory: Negative for cough, shortness of breath and wheezing.   Cardiovascular: Negative for chest pain, palpitations and leg swelling.  Gastrointestinal: Negative for abdominal pain, constipation, diarrhea, nausea and vomiting.  Genitourinary: Negative for dysuria and flank pain.  Musculoskeletal: Negative for arthralgias, back pain, gait problem and neck pain.  Skin: Negative for color change.  Allergic/Immunologic: Negative for environmental allergies and food allergies.  Neurological: Negative for dizziness and headaches.  Hematological: Does not bruise/bleed easily.  Psychiatric/Behavioral: Negative for agitation, behavioral problems (depression) and hallucinations.    Vital Signs: BP 110/80   Pulse 80   Temp 98.3 F (36.8 C)   Resp 16   Ht 5\' 3"  (1.6 m)   Wt 131 lb (59.4 kg)   SpO2 99%   BMI 23.21 kg/m    Physical Exam Vitals reviewed.  Constitutional:      Appearance: Normal appearance.  Cardiovascular:     Rate and Rhythm: Normal rate and regular rhythm.     Pulses: Normal pulses.     Heart sounds: Normal heart sounds.  Pulmonary:     Effort: Pulmonary effort is normal.     Breath sounds: Normal breath sounds.  Musculoskeletal:        General:  Normal range of motion.  Skin:    General: Skin is warm.  Neurological:     General: No focal deficit present.     Mental Status: She is alert and oriented to person, place, and time. Mental status is at baseline.  Psychiatric:        Mood and Affect: Mood normal.        Behavior: Behavior normal.        Thought Content: Thought content normal.     Assessment/Plan: 1. Attention deficit hyperactivity disorder (ADHD), predominantly inattentive type Will continue adderall at this time, discussed she needs to continue with only taking second dose  as needed, prescription should last longer than 30 days Will need to have blood work done prior to next visit to have refills - amphetamine-dextroamphetamine (ADDERALL) 20 MG tablet; Take 1 tablet (20 mg total) by mouth 2 (two) times daily.  Dispense: 60 tablet; Refill: 0  2. Generalized anxiety disorder Anxiety well controlled at this time with the Lexapro, does not feel that dose needs to be increased--will continue with routine monitoring  General Counseling: Velta verbalizes understanding of the findings of todays visit and agrees with plan of treatment. I have discussed any further diagnostic evaluation that may be needed or ordered today. We also reviewed her medications today. she has been encouraged to call the office with any questions or concerns that should arise related to todays visit.      Meds ordered this encounter  Medications  . amphetamine-dextroamphetamine (ADDERALL) 20 MG tablet    Sig: Take 1 tablet (20 mg total) by mouth 2 (two) times daily.    Dispense:  60 tablet    Refill:  0    Time spent: 30 Minutes Time spent includes review of chart, medications, test results and follow-up plan with the patient.  This patient was seen by Theodoro Grist AGNP-C in Collaboration with Dr Lavera Guise as a part of collaborative care agreement     Tanna Furry. Hema Lanza AGNP-C Internal medicine

## 2020-01-18 ENCOUNTER — Encounter: Payer: Self-pay | Admitting: Hospice and Palliative Medicine

## 2020-01-30 ENCOUNTER — Ambulatory Visit: Payer: Self-pay | Admitting: Hospice and Palliative Medicine

## 2020-02-14 ENCOUNTER — Ambulatory Visit: Payer: Self-pay | Admitting: Hospice and Palliative Medicine

## 2020-03-14 LAB — LIPID PANEL WITH LDL/HDL RATIO
Cholesterol, Total: 183 mg/dL (ref 100–199)
HDL: 77 mg/dL (ref >39)
LDL Chol Calc (NIH): 94 mg/dL (ref 0–99)
LDL/HDL Ratio: 1.2 ratio (ref 0.0–3.2)
Triglycerides: 62 mg/dL (ref 0–149)
VLDL Cholesterol Cal: 12 mg/dL (ref 5–40)

## 2020-03-14 LAB — CBC WITH DIFFERENTIAL/PLATELET
Basophils Absolute: 0 10*3/uL (ref 0.0–0.2)
Basos: 0 %
EOS (ABSOLUTE): 0 10*3/uL (ref 0.0–0.4)
Eos: 1 %
Hematocrit: 41.4 % (ref 34.0–46.6)
Hemoglobin: 14.2 g/dL (ref 11.1–15.9)
Immature Grans (Abs): 0 10*3/uL (ref 0.0–0.1)
Immature Granulocytes: 0 %
Lymphocytes Absolute: 1.3 10*3/uL (ref 0.7–3.1)
Lymphs: 28 %
MCH: 30.9 pg (ref 26.6–33.0)
MCHC: 34.3 g/dL (ref 31.5–35.7)
MCV: 90 fL (ref 79–97)
Monocytes Absolute: 0.4 10*3/uL (ref 0.1–0.9)
Monocytes: 8 %
Neutrophils Absolute: 2.8 10*3/uL (ref 1.4–7.0)
Neutrophils: 63 %
Platelets: 214 10*3/uL (ref 150–450)
RBC: 4.59 x10E6/uL (ref 3.77–5.28)
RDW: 12 % (ref 11.7–15.4)
WBC: 4.5 10*3/uL (ref 3.4–10.8)

## 2020-03-14 LAB — COMPREHENSIVE METABOLIC PANEL
ALT: 27 IU/L (ref 0–32)
AST: 24 IU/L (ref 0–40)
Albumin/Globulin Ratio: 2.1 (ref 1.2–2.2)
Albumin: 4.6 g/dL (ref 3.8–4.8)
Alkaline Phosphatase: 53 IU/L (ref 44–121)
BUN/Creatinine Ratio: 18 (ref 9–23)
BUN: 14 mg/dL (ref 6–20)
Bilirubin Total: 0.5 mg/dL (ref 0.0–1.2)
CO2: 21 mmol/L (ref 20–29)
Calcium: 9.5 mg/dL (ref 8.7–10.2)
Chloride: 102 mmol/L (ref 96–106)
Creatinine, Ser: 0.77 mg/dL (ref 0.57–1.00)
GFR calc Af Amer: 116 mL/min/{1.73_m2} (ref >59)
GFR calc non Af Amer: 100 mL/min/{1.73_m2} (ref >59)
Globulin, Total: 2.2 g/dL (ref 1.5–4.5)
Glucose: 87 mg/dL (ref 65–99)
Potassium: 3.9 mmol/L (ref 3.5–5.2)
Sodium: 140 mmol/L (ref 134–144)
Total Protein: 6.8 g/dL (ref 6.0–8.5)

## 2020-03-14 LAB — VITAMIN D 1,25 DIHYDROXY
Vitamin D 1, 25 (OH)2 Total: 52 pg/mL
Vitamin D2 1, 25 (OH)2: <10 pg/mL
Vitamin D3 1, 25 (OH)2: 52 pg/mL

## 2020-03-14 LAB — VITAMIN B12: Vitamin B-12: 365 pg/mL (ref 232–1245)

## 2020-03-19 ENCOUNTER — Other Ambulatory Visit: Payer: Self-pay | Admitting: Hospice and Palliative Medicine

## 2020-03-19 ENCOUNTER — Other Ambulatory Visit: Payer: Self-pay

## 2020-03-19 DIAGNOSIS — Z79899 Other long term (current) drug therapy: Secondary | ICD-10-CM

## 2020-03-19 DIAGNOSIS — F411 Generalized anxiety disorder: Secondary | ICD-10-CM

## 2020-03-19 MED ORDER — ESCITALOPRAM OXALATE 5 MG PO TABS: 5.0000 mg | ORAL_TABLET | Freq: Every day | ORAL | 1 refills | Status: DC

## 2020-03-20 LAB — HCG, SERUM, QUALITATIVE: hCG,Beta Subunit,Qual,Serum: NEGATIVE m[IU]/mL (ref <6)

## 2020-04-02 ENCOUNTER — Other Ambulatory Visit: Payer: Self-pay

## 2020-04-02 ENCOUNTER — Ambulatory Visit: Payer: Self-pay | Admitting: Hospice and Palliative Medicine

## 2020-04-02 ENCOUNTER — Ambulatory Visit: Payer: Self-pay

## 2020-04-02 ENCOUNTER — Encounter: Payer: Self-pay | Admitting: Hospice and Palliative Medicine

## 2020-04-02 VITALS — BP 120/79 | HR 72 | Temp 97.8°F | Resp 16 | Ht 63.0 in | Wt 133.0 lb

## 2020-04-02 DIAGNOSIS — F900 Attention-deficit hyperactivity disorder, predominantly inattentive type: Secondary | ICD-10-CM

## 2020-04-02 DIAGNOSIS — F411 Generalized anxiety disorder: Secondary | ICD-10-CM

## 2020-04-02 DIAGNOSIS — Z79899 Other long term (current) drug therapy: Secondary | ICD-10-CM

## 2020-04-02 DIAGNOSIS — Z1152 Encounter for screening for COVID-19: Secondary | ICD-10-CM

## 2020-04-02 LAB — POC SOFIA 2 FLU + SARS ANTIGEN FIA
Influenza A, POC: NEGATIVE
Influenza B, POC: NEGATIVE
SARS Coronavirus 2 Ag: NEGATIVE

## 2020-04-02 MED ORDER — AMPHETAMINE-DEXTROAMPHETAMINE 20 MG PO TABS: 20.0000 mg | ORAL_TABLET | Freq: Two times a day (BID) | ORAL | 0 refills | Status: DC

## 2020-04-02 MED ORDER — AMPHETAMINE-DEXTROAMPHETAMINE 20 MG PO TABS: 20.0000 mg | ORAL_TABLET | Freq: Two times a day (BID) | ORAL | 0 refills | Status: AC

## 2020-04-02 NOTE — Progress Notes (Signed)
[facility] [place], [place] [address]  Internal MEDICINE  Office Visit Note  Patient Name: Brittany Olson  [id number]  [id number]  Date of Service: 04/02/2020  Chief Complaint  Patient presents with  . Anxiety  . Follow-up    HPI Patient is here for routine follow-up Things have been going well Since last visit she has stopped taking her birth control, went 2 months without a menstrual cycle, sent for pregnancy testing--negative results She has now restarted regular menstrual cycles  She is currently splitting her dose of Adderall in half takes it every morning and at times will take it also in the afternoons Only takes her Adderall on days she is working, her schedule varies but will skip medication of her off days Feels this current dose of Adderall is controlling her symptoms--able to complete tasks she starts and remain focused on one task at a time  Anxiety symptoms have also remained well controlled on Lexapro--rarely takes alprazolam Sleeps well at night, not experiencing negative side effects from medications such as heart palpitations, headaches or insomnia  She is leaving for vacation tomorrow morning, they are requiring a negative COVID test before flying, she went for testing on Sunday but has not received her results yet--not experiencing any symptoms and denies being in close contact with anyone with known COVID19 infection  Current Medication: Outpatient Encounter Medications as of 04/02/2020  Medication Sig  . ALPRAZolam (XANAX) 0.5 MG tablet Take 1 tablet (0.5 mg total) by mouth 3 (three) times daily as needed for anxiety.  Marland Kitchen amphetamine-dextroamphetamine (ADDERALL) 20 MG tablet Take 1 tablet (20 mg total) by mouth 2 (two) times daily.  Marland Kitchen escitalopram (LEXAPRO) 5 MG tablet Take 1 tablet (5 mg total) by mouth daily.  Marland Kitchen ibuprofen (ADVIL) 200 MG tablet Take by mouth.  . [DISCONTINUED] amphetamine-dextroamphetamine (ADDERALL) 20 MG  tablet Take 1 tablet (20 mg total) by mouth 2 (two) times daily.  Marland Kitchen amphetamine-dextroamphetamine (ADDERALL) 20 MG tablet Take 1 tablet (20 mg total) by mouth 2 (two) times daily.  . [DISCONTINUED] amphetamine-dextroamphetamine (ADDERALL) 20 MG tablet Take 1 tablet (20 mg total) by mouth 2 (two) times daily.  . [DISCONTINUED] norgestimate-ethinyl estradiol (ORTHO-CYCLEN) 0.25-35 MG-MCG tablet Take 1 tablet by mouth daily. (Patient not taking: Reported on 04/02/2020)   No facility-administered encounter medications on file as of 04/02/2020.    Surgical History: Past Surgical History:  Procedure Laterality Date  . AUGMENTATION MAMMAPLASTY Bilateral 2010   saline  . NO PAST SURGERIES      Medical History: Past Medical History:  Diagnosis Date  . Abnormal uterine bleeding (AUB)    history  . ADHD   . Anxiety   . H/O miscarriage, currently pregnant    X2  . Hallux valgus    ACQUIRED  . Ovarian cyst    HISTORY  . Perineal laceration during delivery, unspecified, delivered    FIRST DEGREE    Family History: Family History  Problem Relation Age of Onset  . Prostate cancer Father   . Heart attack Paternal Grandmother   . Heart attack Maternal Grandfather   . Breast cancer Sister 64    Social History   Socioeconomic History  . Marital status: Married    Spouse name: Not on file  . Number of children: Not on file  . Years of education: Not on file  . Highest education level: Not on file  Occupational History  . Occupation: HAIR DRESSER  Tobacco Use  . Smoking  status: Never Smoker  . Smokeless tobacco: Never Used  Vaping Use  . Vaping Use: Never used  Substance and Sexual Activity  . Alcohol use: Yes    Alcohol/week: 4.0 standard drinks    Types: 4 Shots of liquor per week    Comment: occ. drink at night to go to sleep  . Drug use: No  . Sexual activity: Yes    Partners: Male    Birth control/protection: Pill  Other Topics Concern  . Not on file  Social History  Narrative  . Not on file   Social Determinants of Health   Financial Resource Strain: Not on file  Food Insecurity: Not on file  Transportation Needs: Not on file  Physical Activity: Not on file  Stress: Not on file  Social Connections: Not on file  Intimate Partner Violence: Not on file      Review of Systems  Constitutional: Negative for chills, diaphoresis and fatigue.  HENT: Negative for ear pain, postnasal drip and sinus pressure.   Eyes: Negative for photophobia, discharge, redness, itching and visual disturbance.  Respiratory: Negative for cough, shortness of breath and wheezing.   Cardiovascular: Negative for chest pain, palpitations and leg swelling.  Gastrointestinal: Negative for abdominal pain, constipation, diarrhea, nausea and vomiting.  Genitourinary: Negative for dysuria and flank pain.  Musculoskeletal: Negative for arthralgias, back pain, gait problem and neck pain.  Skin: Negative for color change.  Allergic/Immunologic: Negative for environmental allergies and food allergies.  Neurological: Negative for dizziness and headaches.  Hematological: Does not bruise/bleed easily.  Psychiatric/Behavioral: Negative for agitation, behavioral problems (depression) and hallucinations.    Vital Signs: BP 120/79   Pulse 72   Temp 97.8 F (36.6 C)   Resp 16   Ht 5\' 3"  (1.6 m)   Wt 133 lb (60.3 kg)   SpO2 99%   BMI 23.56 kg/m    Physical Exam Vitals reviewed.  Constitutional:      Appearance: Normal appearance. She is normal weight.  Cardiovascular:     Rate and Rhythm: Normal rate and regular rhythm.     Pulses: Normal pulses.     Heart sounds: Normal heart sounds.  Pulmonary:     Effort: Pulmonary effort is normal.     Breath sounds: Normal breath sounds.  Abdominal:     General: Abdomen is flat.     Palpations: Abdomen is soft.  Musculoskeletal:        General: Normal range of motion.     Cervical back: Normal range of motion.  Skin:    General:  Skin is warm.  Neurological:     General: No focal deficit present.     Mental Status: She is alert and oriented to person, place, and time. Mental status is at baseline.  Psychiatric:        Mood and Affect: Mood normal.        Behavior: Behavior normal.        Thought Content: Thought content normal.        Judgment: Judgment normal.    Assessment/Plan: 1. Encounter for screening for COVID-19 Testing required for travel Negative results - POC SOFIA 2 FLU + SARS ANTIGEN FIA  2. Attention deficit hyperactivity disorder (ADHD), predominantly inattentive type Continue current dose of Adderall, 1/2 dose as symptoms allow and encouraged skipping doses on her days off 2 months sent today, may not pick up second script prior to 2/10 PDMP reviewed, overdose risk 110 ADD medications potential side effects: Sleep problems  Decreased appetite  Delayed growth ( children and adolescence)  Headaches and stomachaches  Rebound irritability when medication weans off  Tics  Moodiness, anger and irritability  ADD Best practices management: CNS vitals, UDS, [place] CSRS reporting and vital signs every 3-6 months - amphetamine-dextroamphetamine (ADDERALL) 20 MG tablet; Take 1 tablet (20 mg total) by mouth 2 (two) times daily.  Dispense: 60 tablet; Refill: 0 - amphetamine-dextroamphetamine (ADDERALL) 20 MG tablet; Take 1 tablet (20 mg total) by mouth 2 (two) times daily.  Dispense: 60 tablet; Refill: 0  3. Generalized anxiety disorder Remains well controlled on current dose of Lexapro, continue to monitor and adjust therapy as needed  4. Encounter for long-term (current) use of high-risk medication - POCT Urine Drug Screen  General Counseling: Reece verbalizes understanding of the findings of todays visit and agrees with plan of treatment. I have discussed any further diagnostic evaluation that may be needed or ordered today. We also reviewed her medications today. she has been encouraged to call the  office with any questions or concerns that should arise related to todays visit.    Orders Placed This Encounter  Procedures  . POCT Urine Drug Screen  . POC SOFIA 2 FLU + SARS ANTIGEN FIA    Meds ordered this encounter  Medications  . DISCONTD: amphetamine-dextroamphetamine (ADDERALL) 20 MG tablet    Sig: Take 1 tablet (20 mg total) by mouth 2 (two) times daily.    Dispense:  60 tablet    Refill:  0  . amphetamine-dextroamphetamine (ADDERALL) 20 MG tablet    Sig: Take 1 tablet (20 mg total) by mouth 2 (two) times daily.    Dispense:  60 tablet    Refill:  0    Please do not fill before 2/10.  Marland Kitchen amphetamine-dextroamphetamine (ADDERALL) 20 MG tablet    Sig: Take 1 tablet (20 mg total) by mouth 2 (two) times daily.    Dispense:  60 tablet    Refill:  0    Time spent: 30 Minutes Time spent includes review of chart, medications, test results and follow-up plan with the patient.  This patient was seen by Theodoro Grist AGNP-C in Collaboration with Dr Lavera Guise as a part of collaborative care agreement     Tanna Furry. Skarlet Lyons AGNP-C Internal medicine

## 2020-04-03 LAB — POCT URINE DRUG SCREEN
POC Amphetamine UR: NOT DETECTED
POC BENZODIAZEPINES UR: NOT DETECTED
POC Barbiturate UR: NOT DETECTED
POC Cocaine UR: NOT DETECTED
POC Ecstasy UR: NOT DETECTED
POC Marijuana UR: NOT DETECTED
POC Methadone UR: NOT DETECTED
POC Methamphetamine UR: NOT DETECTED
POC Opiate Ur: NOT DETECTED
POC Oxycodone UR: NOT DETECTED
POC PHENCYCLIDINE UR: NOT DETECTED
POC TRICYCLICS UR: NOT DETECTED

## 2020-05-31 ENCOUNTER — Encounter: Payer: Self-pay | Admitting: Hospice and Palliative Medicine

## 2020-05-31 ENCOUNTER — Ambulatory Visit: Payer: Self-pay | Admitting: Hospice and Palliative Medicine

## 2020-05-31 DIAGNOSIS — F900 Attention-deficit hyperactivity disorder, predominantly inattentive type: Secondary | ICD-10-CM

## 2020-05-31 DIAGNOSIS — F411 Generalized anxiety disorder: Secondary | ICD-10-CM

## 2020-05-31 MED ORDER — AMPHETAMINE-DEXTROAMPHETAMINE 20 MG PO TABS: 20.0000 mg | ORAL_TABLET | Freq: Two times a day (BID) | ORAL | 0 refills | Status: AC

## 2020-05-31 MED ORDER — ALPRAZOLAM 0.5 MG PO TABS: 0.5000 mg | ORAL_TABLET | Freq: Two times a day (BID) | ORAL | 0 refills | Status: AC | PRN

## 2020-05-31 NOTE — Progress Notes (Signed)
[facility] [place], [place] [address]  Internal MEDICINE  Office Visit Note  Patient Name: Brittany Olson  [id number]  [id number]  Date of Service: 06/03/2020  Chief Complaint  Patient presents with  . ADHD    2 month f-up    HPI Patient is here for routine follow-up Experiencing increased anxiety due to legal issues with her workplace Requesting refills of alprazolam--last refill per PDMP in 2020, takes only as needed Continues to feel overall that anxiety has been much improved since starting Lexapro, tolerating well without negative side effects Sleeping well each night, feels she is more easily irritated by things and feels more tense lately due to increased stress ADHD well controlled with current dose of Adderall, infrequently requires second dose of medication and skips all together on the weekends  Current Medication: Outpatient Encounter Medications as of 05/31/2020  Medication Sig  . amphetamine-dextroamphetamine (ADDERALL) 20 MG tablet Take 1 tablet (20 mg total) by mouth 2 (two) times daily.  Marland Kitchen amphetamine-dextroamphetamine (ADDERALL) 20 MG tablet Take 1 tablet (20 mg total) by mouth 2 (two) times daily.  Marland Kitchen escitalopram (LEXAPRO) 5 MG tablet Take 1 tablet (5 mg total) by mouth daily.  Marland Kitchen ibuprofen (ADVIL) 200 MG tablet Take by mouth.  . [DISCONTINUED] ALPRAZolam (XANAX) 0.5 MG tablet Take 1 tablet (0.5 mg total) by mouth 3 (three) times daily as needed for anxiety.  . [DISCONTINUED] amphetamine-dextroamphetamine (ADDERALL) 20 MG tablet Take 1 tablet (20 mg total) by mouth 2 (two) times daily.  Marland Kitchen ALPRAZolam (XANAX) 0.5 MG tablet Take 1 tablet (0.5 mg total) by mouth 2 (two) times daily as needed for anxiety.  Marland Kitchen amphetamine-dextroamphetamine (ADDERALL) 20 MG tablet Take 1 tablet (20 mg total) by mouth 2 (two) times daily.   No facility-administered encounter medications on file as of 05/31/2020.    Surgical History: Past Surgical History:   Procedure Laterality Date  . AUGMENTATION MAMMAPLASTY Bilateral 2010   saline  . NO PAST SURGERIES      Medical History: Past Medical History:  Diagnosis Date  . Abnormal uterine bleeding (AUB)    history  . ADHD   . Anxiety   . H/O miscarriage, currently pregnant    X2  . Hallux valgus    ACQUIRED  . Ovarian cyst    HISTORY  . Perineal laceration during delivery, unspecified, delivered    FIRST DEGREE    Family History: Family History  Problem Relation Age of Onset  . Prostate cancer Father   . Heart attack Paternal Grandmother   . Heart attack Maternal Grandfather   . Breast cancer Sister 66    Social History   Socioeconomic History  . Marital status: Married    Spouse name: Not on file  . Number of children: Not on file  . Years of education: Not on file  . Highest education level: Not on file  Occupational History  . Occupation: HAIR DRESSER  Tobacco Use  . Smoking status: Never Smoker  . Smokeless tobacco: Never Used  Vaping Use  . Vaping Use: Never used  Substance and Sexual Activity  . Alcohol use: Yes    Alcohol/week: 4.0 standard drinks    Types: 4 Shots of liquor per week    Comment: occ. drink at night to go to sleep  . Drug use: No  . Sexual activity: Yes    Partners: Male    Birth control/protection: Pill  Other Topics Concern  . Not on file  Social  History Narrative  . Not on file   Social Determinants of Health   Financial Resource Strain: Not on file  Food Insecurity: Not on file  Transportation Needs: Not on file  Physical Activity: Not on file  Stress: Not on file  Social Connections: Not on file  Intimate Partner Violence: Not on file    Review of Systems  Constitutional: Negative for chills, diaphoresis and fatigue.  HENT: Negative for ear pain, postnasal drip and sinus pressure.   Eyes: Negative for photophobia, discharge, redness, itching and visual disturbance.  Respiratory: Negative for cough, shortness of breath  and wheezing.   Cardiovascular: Negative for chest pain, palpitations and leg swelling.  Gastrointestinal: Negative for abdominal pain, constipation, diarrhea, nausea and vomiting.  Genitourinary: Negative for dysuria and flank pain.  Musculoskeletal: Negative for arthralgias, back pain, gait problem and neck pain.  Skin: Negative for color change.  Allergic/Immunologic: Negative for environmental allergies and food allergies.  Neurological: Negative for dizziness and headaches.  Hematological: Does not bruise/bleed easily.  Psychiatric/Behavioral: Negative for agitation, behavioral problems (depression) and hallucinations. The patient is nervous/anxious.     Vital Signs: BP 104/74   Pulse 66   Temp 98.4 F (36.9 C)   Resp 16   Ht 5\' 3"  (1.6 m)   Wt 132 lb 3.2 oz (60 kg)   SpO2 99%   BMI 23.42 kg/m    Physical Exam Vitals reviewed.  Constitutional:      Appearance: Normal appearance. She is normal weight.  Cardiovascular:     Rate and Rhythm: Normal rate and regular rhythm.     Pulses: Normal pulses.     Heart sounds: Normal heart sounds.  Pulmonary:     Effort: Pulmonary effort is normal.     Breath sounds: Normal breath sounds.  Abdominal:     General: Abdomen is flat.     Palpations: Abdomen is soft.  Musculoskeletal:        General: Normal range of motion.     Cervical back: Normal range of motion.  Skin:    General: Skin is warm.  Neurological:     General: No focal deficit present.     Mental Status: She is alert and oriented to person, place, and time. Mental status is at baseline.  Psychiatric:        Mood and Affect: Mood normal.        Behavior: Behavior normal.        Thought Content: Thought content normal.        Judgment: Judgment normal.     Assessment/Plan: 1. Generalized anxiety disorder Continue with Lexapro, will consider increasing dose if anxiety continues to be uncontrolled, may use alprazolam as needed [place] Controlled Substance Database  was reviewed by me for overdose risk score (ORS) Reviewed risks and possible side effects associated with taking opiates, benzodiazepines and other CNS depressants. Combination of these could cause dizziness and drowsiness. Advised patient not to drive or operate machinery when taking these medications, as patient's and other's life can be at risk and will have consequences. Patient verbalized understanding in this matter. Dependence and abuse for these drugs will be monitored closely. A Controlled substance policy and procedure is on file which allows [facility] to order a urine drug screen test at any visit. Patient understands and agrees with the plan - ALPRAZolam (XANAX) 0.5 MG tablet; Take 1 tablet (0.5 mg total) by mouth 2 (two) times daily as needed for anxiety.  Dispense: 60 tablet; Refill:  0  2. Attention deficit hyperactivity disorder (ADHD), predominantly inattentive type Continue with current dose of Adderall, encouraged to continue with second dose as needed and skipping doses on off days and weekends - amphetamine-dextroamphetamine (ADDERALL) 20 MG tablet; Take 1 tablet (20 mg total) by mouth 2 (two) times daily.  Dispense: 60 tablet; Refill: 0 - amphetamine-dextroamphetamine (ADDERALL) 20 MG tablet; Take 1 tablet (20 mg total) by mouth 2 (two) times daily.  Dispense: 60 tablet; Refill: 0  General Counseling: Jemia verbalizes understanding of the findings of todays visit and agrees with plan of treatment. I have discussed any further diagnostic evaluation that may be needed or ordered today. We also reviewed her medications today. she has been encouraged to call the office with any questions or concerns that should arise related to todays visit.   Meds ordered this encounter  Medications  . ALPRAZolam (XANAX) 0.5 MG tablet    Sig: Take 1 tablet (0.5 mg total) by mouth 2 (two) times daily as needed for anxiety.    Dispense:  60 tablet    Refill:  0  .  amphetamine-dextroamphetamine (ADDERALL) 20 MG tablet    Sig: Take 1 tablet (20 mg total) by mouth 2 (two) times daily.    Dispense:  60 tablet    Refill:  0  . amphetamine-dextroamphetamine (ADDERALL) 20 MG tablet    Sig: Take 1 tablet (20 mg total) by mouth 2 (two) times daily.    Dispense:  60 tablet    Refill:  0    Do not fill prior 06/28/20.    Time spent: 30 Minutes Time spent includes review of chart, medications, test results and follow-up plan with the patient.  This patient was seen by Theodoro Grist AGNP-C in Collaboration with Dr Lavera Guise as a part of collaborative care agreement     Tanna Furry. Zamir Staples AGNP-C Internal medicine

## 2020-06-03 ENCOUNTER — Encounter: Payer: Self-pay | Admitting: Hospice and Palliative Medicine

## 2020-07-22 ENCOUNTER — Other Ambulatory Visit: Payer: Self-pay

## 2020-07-22 DIAGNOSIS — F411 Generalized anxiety disorder: Secondary | ICD-10-CM

## 2020-07-22 MED ORDER — ESCITALOPRAM OXALATE 5 MG PO TABS: 5.0000 mg | ORAL_TABLET | Freq: Every day | ORAL | 0 refills | Status: AC

## 2020-07-29 ENCOUNTER — Ambulatory Visit: Payer: Self-pay | Admitting: Nurse Practitioner

## 2020-07-29 ENCOUNTER — Other Ambulatory Visit: Payer: Self-pay

## 2020-10-04 ENCOUNTER — Telehealth: Payer: Self-pay

## 2020-10-04 NOTE — Telephone Encounter (Signed)
Pt called about [facility] bill stating she got a bill for $596.00 and says she continues to get a bill and now it has gone into collections.  She advised she has brought the bill to the clinic 2 times already and has been told it has been taken care of but pt still gets another bill.  I asked pt to bring the bill to the office again and make sure I get it ATTN'd to [facility] and I will make sure it gets to the proper person.

## 2020-10-28 ENCOUNTER — Telehealth: Payer: Self-pay

## 2020-10-28 NOTE — Telephone Encounter (Signed)
Spoke with [facility] (Nydia) about her bills suppose to bill to indigent clinic she said we already call twice  no action done she did today and advised pt that they not to send tp collection and also disregard bills and they will take this pt and pt advised that please disregard bill and also if she received again call me ASAP

## 2020-12-02 ENCOUNTER — Other Ambulatory Visit: Payer: Self-pay

## 2020-12-02 ENCOUNTER — Ambulatory Visit (INDEPENDENT_AMBULATORY_CARE_PROVIDER_SITE_OTHER): Payer: Self-pay | Admitting: Podiatry

## 2020-12-02 ENCOUNTER — Ambulatory Visit: Payer: Self-pay

## 2020-12-02 DIAGNOSIS — M659 Synovitis and tenosynovitis, unspecified: Secondary | ICD-10-CM

## 2020-12-02 DIAGNOSIS — M21622 Bunionette of left foot: Secondary | ICD-10-CM

## 2020-12-02 DIAGNOSIS — N83209 Unspecified ovarian cyst, unspecified side: Secondary | ICD-10-CM | POA: Insufficient documentation

## 2020-12-02 DIAGNOSIS — M2010 Hallux valgus (acquired), unspecified foot: Secondary | ICD-10-CM | POA: Insufficient documentation
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
## 2020-12-16 ENCOUNTER — Ambulatory Visit
Admission: RE | Admit: 2020-12-16 | Discharge: 2020-12-16 | Disposition: A | Discharge status: Home / Self Care | Payer: No Typology Code available for payment source | Source: Ambulatory Visit | Attending: Podiatry | Admitting: Podiatry

## 2020-12-16 ENCOUNTER — Other Ambulatory Visit: Payer: Self-pay

## 2020-12-16 DIAGNOSIS — M659 Synovitis and tenosynovitis, unspecified: Secondary | ICD-10-CM

## 2021-01-04 ENCOUNTER — Encounter: Payer: Self-pay | Admitting: Podiatry

## 2021-01-20 ENCOUNTER — Telehealth: Payer: Self-pay | Admitting: *Deleted

## 2021-01-20 NOTE — Telephone Encounter (Signed)
"  I was speaking with Dr. Amalia Hailey through [facility].  I had sent a message about there is an ankle brace that he wanted to recommend to me.  I haven't heard back a reply since 10/19.  So, if someone could get back in touch with me through MyChart  or by calling, so I can see what ankle brace I should be purchasing.  Thank you so much."

## 2021-01-21 NOTE — Telephone Encounter (Signed)
The patient can come in and pick up an ankle brace here in our office.  Please contact the patient and inform her.  No charge for the ankle brace.  Please demonstrate how to apply it.  Thanks, Dr. Amalia Hailey

## 2021-01-22 NOTE — Telephone Encounter (Signed)
I attempted to return her call.  I apologized for the delayed response.  I informed her that she can come by the office to get a brace per Dr. Amalia Hailey.  I told her that we just need to know what size shoe she wears.  I asked her to give [place] a call if you has any questions.  The brace is at the front desk.  I instructed Elmyra Ricks to let me know when she comes to get it and I will show her how to wear the brace.

## 2021-03-13 ENCOUNTER — Encounter: Payer: Self-pay | Admitting: Internal Medicine

## 2021-03-21 ENCOUNTER — Encounter: Payer: Self-pay | Admitting: Podiatry

## 2021-03-21 ENCOUNTER — Other Ambulatory Visit: Payer: Self-pay

## 2021-03-21 ENCOUNTER — Ambulatory Visit (INDEPENDENT_AMBULATORY_CARE_PROVIDER_SITE_OTHER): Payer: Self-pay | Admitting: Podiatry

## 2021-03-21 DIAGNOSIS — M21622 Bunionette of left foot: Secondary | ICD-10-CM

## 2021-03-21 DIAGNOSIS — M659 Synovitis and tenosynovitis, unspecified: Secondary | ICD-10-CM

## 2021-03-25 ENCOUNTER — Telehealth: Payer: Self-pay | Admitting: Podiatry

## 2021-03-25 NOTE — Telephone Encounter (Signed)
She was calling to find out if you have a date for her surgery yet because they have not call her to let her know

## 2021-03-26 NOTE — Telephone Encounter (Signed)
[name] did you get the surgery forms for this patient from [place]? It looks like she was expecting [place] to call her. Thanks, Dr. Amalia Hailey

## 2021-03-29 IMAGING — MG MM  DIGITAL DIAGNOSTIC BREAST BILAT IMPLANT W/ TOMO W/ CAD
8 of 15 series · 8 of 35 positions shown · non-contrast
Comparison: None.

CLINICAL DATA: 33-year-old female presenting for evaluation of a
palpable lump described as a pea-sized lump in the right breast at
11 o'clock near the axilla. This was palpated on clinical breast
exam. The patient does have family history of breast cancer in her
sister diagnosed at age 32. The patient's sister has been
genetically tested and is negative for a gene mutation.

EXAM:
2D DIGITAL DIAGNOSTIC BILATERAL MAMMOGRAM WITH IMPLANTS, CAD AND
ADJUNCT TOMO
The patient has retropectoral implants. Standard and implant
displaced views were performed.
RIGHT BREAST ULTRASOUND

[R MLO (1 of 2)]
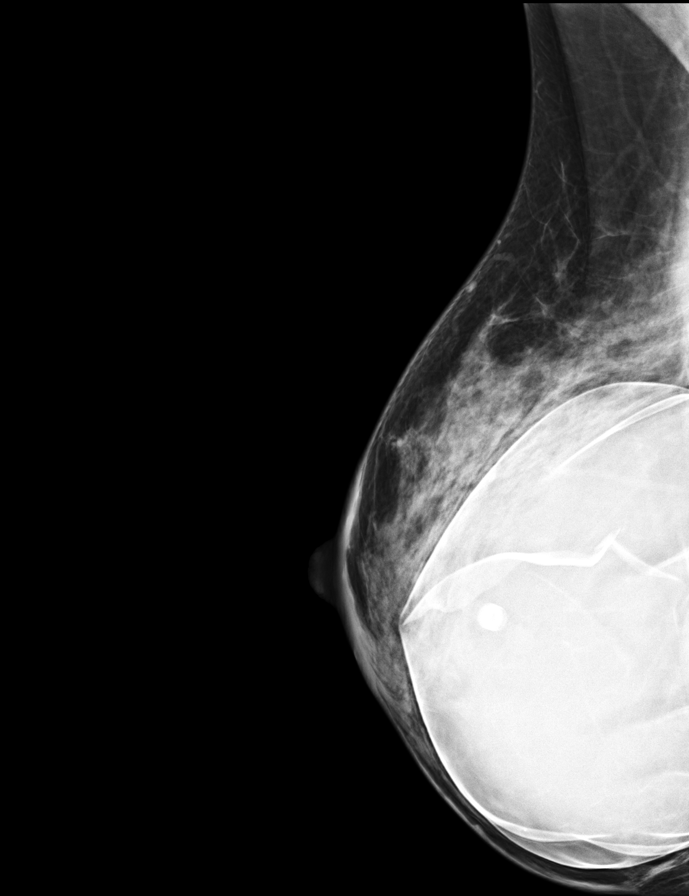

[L MLO]
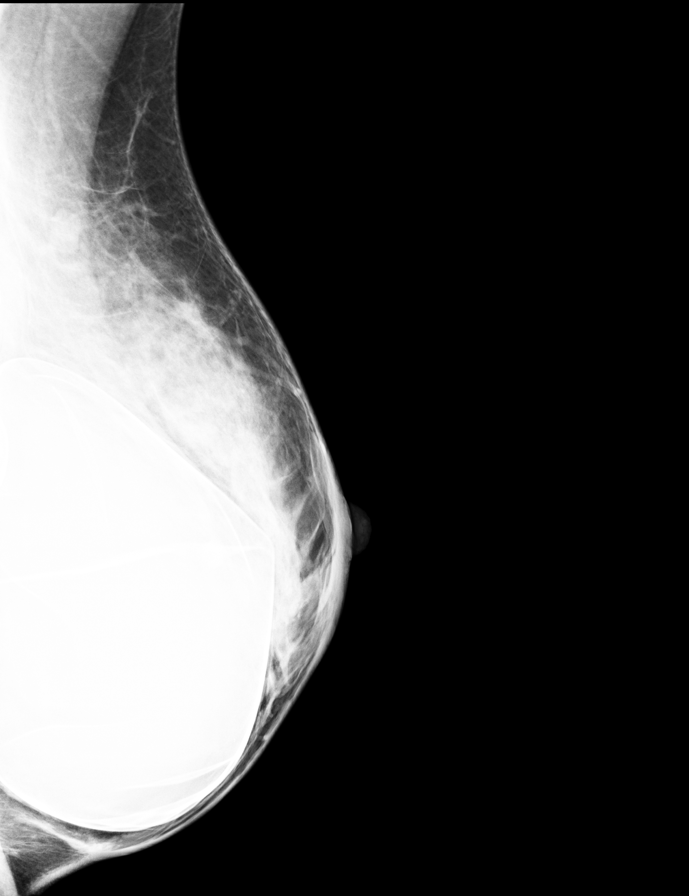

[R MLO (2 of 2)]
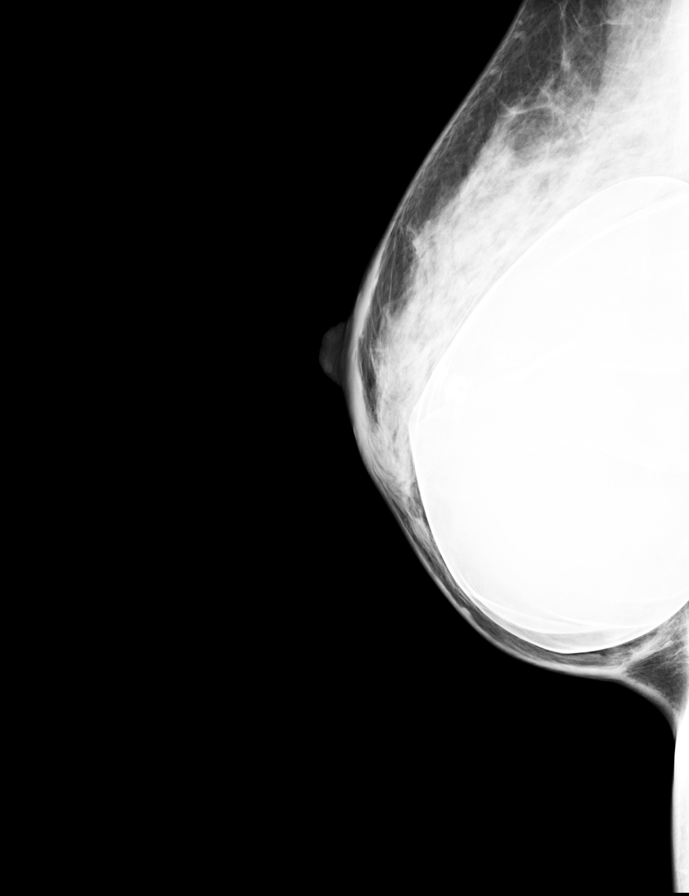

[L CC]
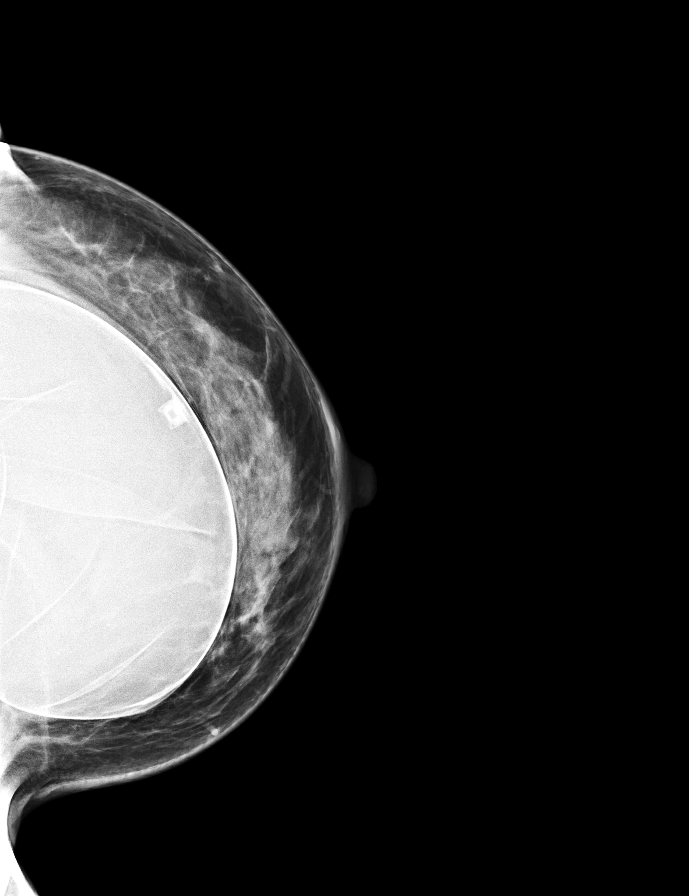

[R CC]
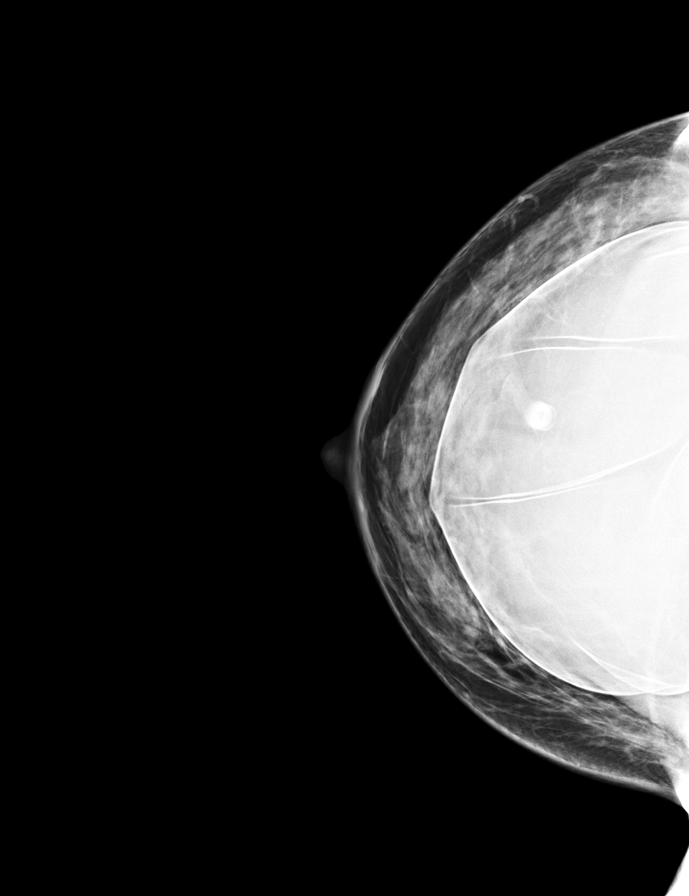

[L MLO synth-2D (1 of 2)]
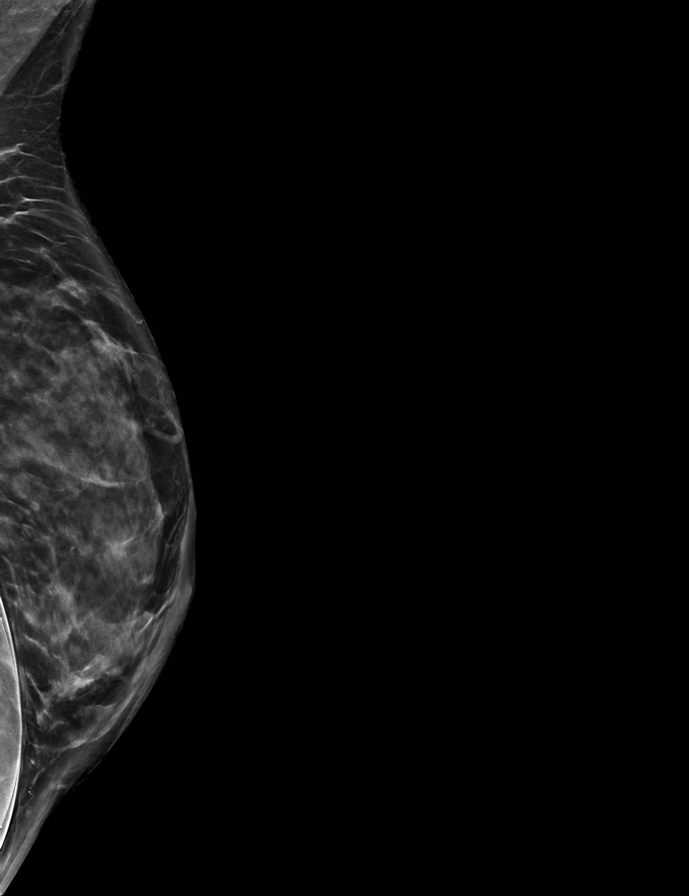

[R MLO synth-2D]
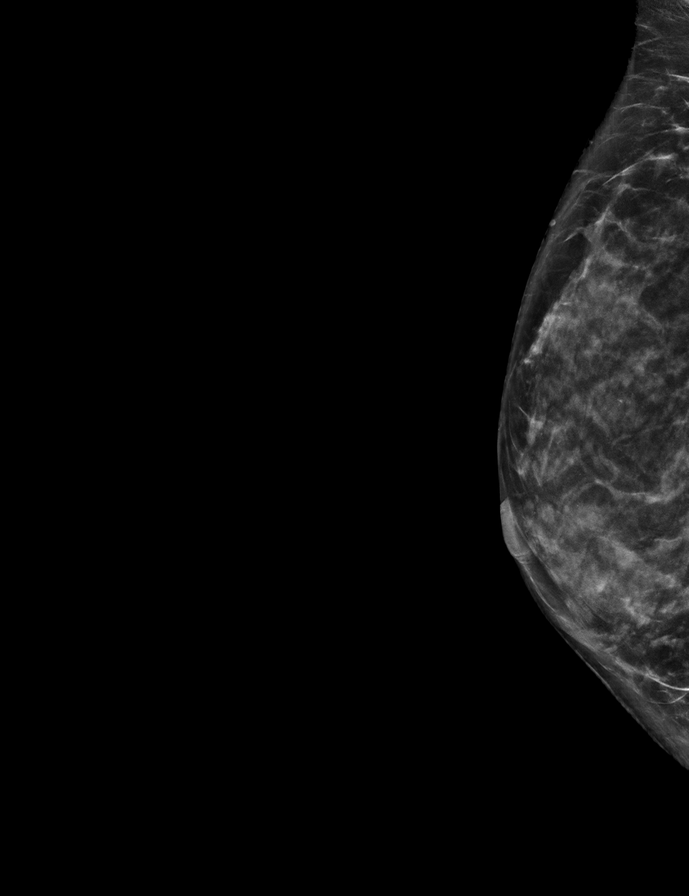

[L MLO synth-2D (2 of 2)]
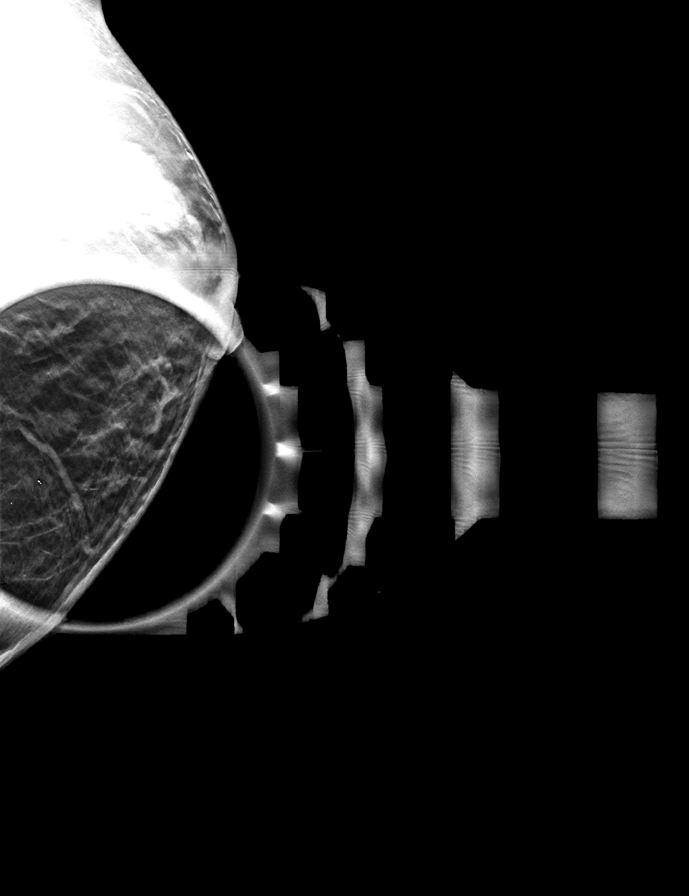

[8 of 35 positions shown; findings below may reference images not displayed]

ACR Breast Density Category d: The breast tissue is extremely dense,
which lowers the sensitivity of mammography.
FINDINGS: An asymmetry was initially seen in the inferior left breast on the
implant displaced view. However this resolves with spot compression
tomosynthesis imaging. No other suspicious calcifications, masses or
areas of distortion are seen in the bilateral breasts.

Mammographic images were processed with CAD.

Physical exam of the palpable site demonstrates a BB sized
superficial palpable lump.

Ultrasound targeted to the upper-outer quadrant of the right breast
demonstrates normal fibroglandular tissue. No suspicious masses or
areas of shadowing are identified.
IMPRESSION: 1. There are no mammographic or targeted sonographic abnormalities
at the palpable site of concern in the upper-outer right breast.

2.  No mammographic evidence of malignancy in the bilateral breasts.

RECOMMENDATION:
1. Clinical follow-up recommended for the palpable area of concern
in the upper-outer right breast. Any further workup should be based
on clinical grounds.

2.  Screening mammogram in one year.(Code:[id number]-Y-70Q)

3. Consider genetics assessment to determine the patient's lifetime
risk of breast cancer given her family history, and breast tissue
density if this has not already been performed. Per American Cancer
Society guidelines, if the patient has a calculated lifetime risk of
developing breast cancer of greater than 20%, annual screening MRI
of the breasts would be recommended at the time of screening
mammography.

I have discussed the findings and recommendations with the patient.
If applicable, a reminder letter will be sent to the patient
regarding the next appointment.

BI-RADS CATEGORY  1: Negative.
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
## 2021-04-11 ENCOUNTER — Ambulatory Visit (INDEPENDENT_AMBULATORY_CARE_PROVIDER_SITE_OTHER): Payer: Self-pay | Admitting: Podiatry

## 2021-04-11 ENCOUNTER — Other Ambulatory Visit: Payer: Self-pay

## 2021-04-11 ENCOUNTER — Encounter: Payer: Self-pay | Admitting: Podiatry

## 2021-04-11 DIAGNOSIS — B070 Plantar wart: Secondary | ICD-10-CM
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
## 2021-05-01 ENCOUNTER — Other Ambulatory Visit: Payer: Self-pay | Admitting: Podiatry

## 2021-05-01 ENCOUNTER — Encounter: Payer: Self-pay | Admitting: Podiatry

## 2021-05-01 DIAGNOSIS — D2372 Other benign neoplasm of skin of left lower limb, including hip: Secondary | ICD-10-CM

## 2021-05-01 DIAGNOSIS — M65872 Other synovitis and tenosynovitis, left ankle and foot: Secondary | ICD-10-CM

## 2021-05-01 DIAGNOSIS — M2012 Hallux valgus (acquired), left foot: Secondary | ICD-10-CM

## 2021-05-01 MED ORDER — OXYCODONE-ACETAMINOPHEN 5-325 MG PO TABS: 1.0000 | ORAL_TABLET | ORAL | 0 refills | Status: AC | PRN

## 2021-05-01 MED ORDER — IBUPROFEN 800 MG PO TABS: 800.0000 mg | ORAL_TABLET | Freq: Three times a day (TID) | ORAL | 1 refills | Status: AC

## 2021-05-01 NOTE — Progress Notes (Signed)
PRN postop 

## 2021-05-09 ENCOUNTER — Ambulatory Visit (INDEPENDENT_AMBULATORY_CARE_PROVIDER_SITE_OTHER): Payer: Self-pay | Admitting: Podiatry

## 2021-05-09 ENCOUNTER — Ambulatory Visit (INDEPENDENT_AMBULATORY_CARE_PROVIDER_SITE_OTHER): Payer: PRIVATE HEALTH INSURANCE

## 2021-05-09 ENCOUNTER — Other Ambulatory Visit: Payer: Self-pay

## 2021-05-09 ENCOUNTER — Encounter: Payer: Self-pay | Admitting: Podiatry

## 2021-05-09 VITALS — BP 100/71 | HR 68 | Temp 98.1°F

## 2021-05-09 DIAGNOSIS — M659 Synovitis and tenosynovitis, unspecified: Secondary | ICD-10-CM

## 2021-05-09 DIAGNOSIS — M65172 Other infective (teno)synovitis, left ankle and foot: Secondary | ICD-10-CM

## 2021-05-09 DIAGNOSIS — M21622 Bunionette of left foot: Secondary | ICD-10-CM

## 2021-05-09 DIAGNOSIS — Z9889 Other specified postprocedural states: Secondary | ICD-10-CM

## 2021-05-09 NOTE — Progress Notes (Signed)
° °  Subjective:  Patient presents today status post left ankle arthroscopy with tailor's bunionectomy.  Excision of plantar verruca right posterior ankle. DOS: 05/01/2021.  Patient states that she is doing well.  The most pain is associated to her left ankle arthroscopy site.  She has kept the dressings clean dry and intact until today and she presents for further treatment and evaluation  Past Medical History:  Diagnosis Date   Abnormal uterine bleeding (AUB)    history   ADHD    Anxiety    H/O miscarriage, currently pregnant    X2   Hallux valgus    ACQUIRED   Ovarian cyst    HISTORY   Perineal laceration during delivery, unspecified, delivered    FIRST DEGREE   Past Medical History:  Diagnosis Date   Abnormal uterine bleeding (AUB)    history   ADHD    Anxiety    H/O miscarriage, currently pregnant    X2   Hallux valgus    ACQUIRED   Ovarian cyst    HISTORY   Perineal laceration during delivery, unspecified, delivered    FIRST DEGREE   No Known Allergies   Objective/Physical Exam Neurovascular status intact.  Skin incisions appear to be well coapted with sutures intact. No sign of infectious process noted. No dehiscence. No active bleeding noted. Moderate edema noted to the surgical extremity. Open wound noted to the right posterior Achilles where the plantar verruca excision was performed.  Granular wound base.  No inflammation or erythema.  There is some slight serous drainage.  Radiographic Exam:  Osteotomy to the fifth metatarsal appears stable and intact with the orthopedic screw intact.  Good reduction of the tailor's bunion site.  Assessment: 1. s/p ankle arthroscopy and tailor's bunionectomy left.  Excision of verruca right posterior ankle. DOS: 05/01/2021   Plan of Care:  1. Patient was evaluated. X-rays reviewed 2.  Dressings changed.  Recommend Ace wrap daily to the left foot.  Continue weightbearing in the cam boot left 3.  Prisma collagen dressing was  provided for the patient to apply daily to the posterior ankle wound right 4.  Return to clinic in 1 week for suture removal   Edrick Kins, DPM [facility]  Dr. Edrick Kins, DPM    [address], [place] [address]                Office [phone]  Fax [phone]

## 2021-05-16 ENCOUNTER — Ambulatory Visit (INDEPENDENT_AMBULATORY_CARE_PROVIDER_SITE_OTHER): Payer: Self-pay | Admitting: Podiatry

## 2021-05-16 ENCOUNTER — Encounter: Payer: Self-pay | Admitting: Podiatry

## 2021-05-16 ENCOUNTER — Other Ambulatory Visit: Payer: Self-pay

## 2021-05-16 DIAGNOSIS — Z9889 Other specified postprocedural states: Secondary | ICD-10-CM

## 2021-05-23 ENCOUNTER — Ambulatory Visit (INDEPENDENT_AMBULATORY_CARE_PROVIDER_SITE_OTHER): Payer: PRIVATE HEALTH INSURANCE

## 2021-05-23 ENCOUNTER — Ambulatory Visit (INDEPENDENT_AMBULATORY_CARE_PROVIDER_SITE_OTHER): Payer: PRIVATE HEALTH INSURANCE | Admitting: Podiatry

## 2021-05-23 ENCOUNTER — Other Ambulatory Visit: Payer: Self-pay

## 2021-05-23 DIAGNOSIS — Z9889 Other specified postprocedural states: Secondary | ICD-10-CM

## 2021-05-23 DIAGNOSIS — M659 Synovitis and tenosynovitis, unspecified: Secondary | ICD-10-CM

## 2021-05-23 NOTE — Progress Notes (Signed)
? ?  Subjective:  ?Patient presents today status post left ankle arthroscopy with tailor's bunionectomy.  Excision of plantar verruca right posterior ankle. DOS: 05/01/2021.  Patient states that she did sustain a fall injury the other day but overall she is doing well.  She says that although it did hurt for a few days the pain has subsided somewhat.  Presenting for follow-up treatment and evaluation ? ?Past Medical History:  ?Diagnosis Date  ? Abnormal uterine bleeding (AUB)   ? history  ? ADHD   ? Anxiety   ? H/O miscarriage, currently pregnant   ? X2  ? Hallux valgus   ? ACQUIRED  ? Ovarian cyst   ? HISTORY  ? Perineal laceration during delivery, unspecified, delivered   ? FIRST DEGREE  ? ?Past Medical History:  ?Diagnosis Date  ? Abnormal uterine bleeding (AUB)   ? history  ? ADHD   ? Anxiety   ? H/O miscarriage, currently pregnant   ? X2  ? Hallux valgus   ? ACQUIRED  ? Ovarian cyst   ? HISTORY  ? Perineal laceration during delivery, unspecified, delivered   ? FIRST DEGREE  ? ?No Known Allergies ? ? ?Objective/Physical Exam ?Neurovascular status intact.  Skin incisions healed.  No sign of infectious process noted. No dehiscence. No active bleeding noted.  Improved edema.  The hypersensitivity with paresthesia to the digits of the left foot.  Mostly resolved except for the middle toe.  There is some residual edema noted to the left foot and ankle  ?improvement of the wound noted to the right posterior Achilles where the plantar verruca excision was performed.  Granular wound base.  No inflammation or erythema. ? ?Assessment: ?1. s/p ankle arthroscopy and tailor's bunionectomy left.  Excision of verruca right posterior ankle. DOS: 05/01/2021 ? ? ?Plan of Care:  ?1. Patient was evaluated. ?2.  Continue weightbearing in the cam boot. ?3.  Order placed for physical therapy at [facility].  Patient may slowly transition out of the cam boot into good supportive tennis shoes and sneakers as directed by physical therapy ?4.   Continue collagen Prisma to the excision site of the verruca right posterior ankle with a light dressing ?5.  Return to clinic 3 weeks ? ? ?Edrick Kins, DPM ?[place] ? ?Dr. Edrick Kins, DPM  ?  ?[address]                                    ?[place], [place] [address]                ?Office [phone]  ?Fax [phone] ? ? ? ? ? ?

## 2021-05-23 NOTE — Progress Notes (Signed)
° °  Subjective:  Patient presents today status post left ankle arthroscopy with tailor's bunionectomy.  Excision of plantar verruca right posterior ankle. DOS: 05/01/2021.  Patient states that she is doing well.  Patient states that most recently she has been experiencing some burning and needle sensation to the toes of the left foot.  Past Medical History:  Diagnosis Date   Abnormal uterine bleeding (AUB)    history   ADHD    Anxiety    H/O miscarriage, currently pregnant    X2   Hallux valgus    ACQUIRED   Ovarian cyst    HISTORY   Perineal laceration during delivery, unspecified, delivered    FIRST DEGREE   Past Medical History:  Diagnosis Date   Abnormal uterine bleeding (AUB)    history   ADHD    Anxiety    H/O miscarriage, currently pregnant    X2   Hallux valgus    ACQUIRED   Ovarian cyst    HISTORY   Perineal laceration during delivery, unspecified, delivered    FIRST DEGREE   No Known Allergies   Objective/Physical Exam Neurovascular status intact.  Skin incisions appear to be well coapted with sutures intact. No sign of infectious process noted. No dehiscence. No active bleeding noted.  Improved edema.  There is some hypersensitivity with paresthesia to the digits 1-4 of the left foot. Open wound noted to the right posterior Achilles where the plantar verruca excision was performed.  Granular wound base.  No inflammation or erythema.  Overall there is some good improvement of the excision site of the plantar verruca to the posterior ankle right.  Assessment: 1. s/p ankle arthroscopy and tailor's bunionectomy left.  Excision of verruca right posterior ankle. DOS: 05/01/2021   Plan of Care:  1. Patient was evaluated. 2.  Sutures removed. 3.  Due to the neuritis and hypersensitivity of the left foot we are going to discontinue the Ace wrap.  Continue weightbearing in the cam boot.  Patient may slowly make a transition out of the cam boot into good supportive shoes  and sneakers of the next few weeks 4.  Continue collagen prisma to the excision site of the verruca right 5.  Patient planning to return to work 05/27/2021 light shift 6.  Return to clinic 2 weeks   Edrick Kins, DPM [facility]  Dr. Edrick Kins, DPM    [address] [place], [place] [address]                Office [phone]  Fax [phone]

## 2021-05-30 ENCOUNTER — Encounter: Payer: Self-pay | Admitting: Podiatry

## 2021-06-11 ENCOUNTER — Other Ambulatory Visit: Payer: Self-pay | Admitting: Obstetrics and Gynecology

## 2021-06-11 DIAGNOSIS — R928 Other abnormal and inconclusive findings on diagnostic imaging of breast: Secondary | ICD-10-CM

## 2021-06-13 ENCOUNTER — Other Ambulatory Visit: Payer: Self-pay

## 2021-06-13 ENCOUNTER — Encounter: Payer: Self-pay | Admitting: Podiatry

## 2021-06-13 ENCOUNTER — Ambulatory Visit (INDEPENDENT_AMBULATORY_CARE_PROVIDER_SITE_OTHER): Payer: PRIVATE HEALTH INSURANCE | Admitting: Podiatry

## 2021-06-13 DIAGNOSIS — Z9889 Other specified postprocedural states: Secondary | ICD-10-CM

## 2021-06-22 NOTE — Progress Notes (Signed)
? ?  Subjective:  ?Patient presents today status post left ankle arthroscopy with tailor's bunionectomy.  Excision of plantar verruca right posterior ankle. DOS: 05/01/2021.  Overall there is significant improvement.  Patient states that physical therapy has helped significantly.  No new complaints at this time.  She is wearing tennis shoes today. ? ?Past Medical History:  ?Diagnosis Date  ? Abnormal uterine bleeding (AUB)   ? history  ? ADHD   ? Anxiety   ? H/O miscarriage, currently pregnant   ? X2  ? Hallux valgus   ? ACQUIRED  ? Ovarian cyst   ? HISTORY  ? Perineal laceration during delivery, unspecified, delivered   ? FIRST DEGREE  ? ?Past Medical History:  ?Diagnosis Date  ? Abnormal uterine bleeding (AUB)   ? history  ? ADHD   ? Anxiety   ? H/O miscarriage, currently pregnant   ? X2  ? Hallux valgus   ? ACQUIRED  ? Ovarian cyst   ? HISTORY  ? Perineal laceration during delivery, unspecified, delivered   ? FIRST DEGREE  ? ?No Known Allergies ? ? ?Objective/Physical Exam ?Neurovascular status intact.  Skin incisions healed.  No sign of infectious process noted.  Minimal edema.  There continues to be some slight hyper sensitivity and paresthesia to the left foot.  Overall improvement however ?The open wound to the right posterior ankle where the verruca was excised has healed completely.  There is some slight hyperkeratotic callus tissue to the freshly healed area ? ?Assessment: ?1. s/p ankle arthroscopy and tailor's bunionectomy left.  Excision of verruca right posterior ankle. DOS: 05/01/2021 ? ? ?Plan of Care:  ?1. Patient was evaluated. ?2.  Overall the patient is improving.  Continue wearing good supportive shoes and sneakers ?3.  Continue physical therapy at [facility] ?4.  The verruca lesion to the right posterior ankle has completely healed.  Recommend hydrating lotion to the area daily ?5.  Recommend OTC Voltaren topical anti-inflammatory gel to the left ankle daily ?6.  Return to clinic 6 weeks ? ?Edrick Kins, DPM ?[place] ? ?Dr. Edrick Kins, DPM  ?  ?[address]                                    ?[place], [place] [address]                ?Office [phone]  ?Fax [phone] ? ? ? ? ? ?

## 2021-07-07 ENCOUNTER — Ambulatory Visit
Admission: RE | Admit: 2021-07-07 | Discharge: 2021-07-07 | Disposition: A | Discharge status: Home / Self Care | Payer: No Typology Code available for payment source | Source: Ambulatory Visit | Attending: Obstetrics and Gynecology | Admitting: Obstetrics and Gynecology

## 2021-07-07 ENCOUNTER — Ambulatory Visit
Admission: RE | Admit: 2021-07-07 | Discharge: 2021-07-07 | Disposition: A | Discharge status: Home / Self Care | Payer: Self-pay | Source: Ambulatory Visit | Attending: Obstetrics and Gynecology | Admitting: Obstetrics and Gynecology

## 2021-07-07 DIAGNOSIS — R928 Other abnormal and inconclusive findings on diagnostic imaging of breast: Secondary | ICD-10-CM

## 2021-07-25 ENCOUNTER — Encounter: Payer: Self-pay | Admitting: Podiatry

## 2021-07-25 ENCOUNTER — Ambulatory Visit (INDEPENDENT_AMBULATORY_CARE_PROVIDER_SITE_OTHER): Payer: PRIVATE HEALTH INSURANCE | Admitting: Podiatry

## 2021-07-25 DIAGNOSIS — Z9889 Other specified postprocedural states: Secondary | ICD-10-CM

## 2021-07-25 NOTE — Progress Notes (Signed)
? ?  Subjective:  ?Patient presents today status post left ankle arthroscopy with tailor's bunionectomy.  Excision of plantar verruca right posterior ankle. DOS: 05/01/2021.  Patient is doing very well.  She says she has no pain or tenderness associated to the foot or ankle.  She says physical therapy has been excellent.  She presents for follow-up ? ?Past Medical History:  ?Diagnosis Date  ? Abnormal uterine bleeding (AUB)   ? history  ? ADHD   ? Anxiety   ? H/O miscarriage, currently pregnant   ? X2  ? Hallux valgus   ? ACQUIRED  ? Ovarian cyst   ? HISTORY  ? Perineal laceration during delivery, unspecified, delivered   ? FIRST DEGREE  ? ?Past Medical History:  ?Diagnosis Date  ? Abnormal uterine bleeding (AUB)   ? history  ? ADHD   ? Anxiety   ? H/O miscarriage, currently pregnant   ? X2  ? Hallux valgus   ? ACQUIRED  ? Ovarian cyst   ? HISTORY  ? Perineal laceration during delivery, unspecified, delivered   ? FIRST DEGREE  ? ?No Known Allergies ? ? ?Objective/Physical Exam ?Neurovascular status intact.  Skin incisions healed.  Plantar verruca lesion healed.  Negative for any significant tenderness to palpation along the fifth metatarsal left or the ankle joint.  Good range of motion.  No edema.  Well-healed surgical foot and ankle ? ?Assessment: ?1. s/p ankle arthroscopy and tailor's bunionectomy left.  Excision of verruca right posterior ankle. DOS: 05/01/2021 ? ? ?Plan of Care:  ?1. Patient was evaluated. ?2.  Overall the patient is doing very well.  She may resume full activity no restrictions ?3.  Recommend good supportive shoes and sneakers ?4.  Return to clinic as needed ? ?Edrick Kins, DPM ?[place] ? ?Dr. Edrick Kins, DPM  ?  ?[address]                                    ?[place], [place] [address]                ?Office [phone]  ?Fax [phone] ? ? ? ? ? ?

## 2021-09-10 ENCOUNTER — Telehealth: Payer: Self-pay | Admitting: Podiatry

## 2021-09-10 NOTE — Telephone Encounter (Addendum)
I need an updated itemized statement on Brittany Olson. I recently received an itemized statement but I believe a charge is missing. The HIPPA should be on file. If you could please fax the statement to [phone] and if you have any questions, you can give me a call at [phone]. Thank you.

## 2021-09-10 NOTE — Telephone Encounter (Signed)
Called and spoke to Brittany Olson to confirm dates of service needed for itemized billing statement she requested. Updated statement faxed to [phone].

## 2022-01-30 ENCOUNTER — Telehealth: Payer: Self-pay

## 2022-01-30 NOTE — Telephone Encounter (Signed)
Pt called Monday  that she received a bill from [facility] from 2021 pt said she will drop I called today and LMOM that I am still waiting for bill once a I have bill then I can submit to [facility]

## 2022-04-06 ENCOUNTER — Telehealth: Payer: Self-pay

## 2022-04-06 NOTE — Telephone Encounter (Addendum)
Done

## 2022-04-06 NOTE — Telephone Encounter (Addendum)
Called [facility]  [phone] again for bills from labslips given from [facility] lab slips as per destiny from [place] that she will re do invoice  and send bill to [place] and also clear her from collection department too and also I tried to give her she refused  talk to [place] she said my her attorney will call us  and also relayed message to dr Humphrey Rolls  and also we send to [place] on 02/27/22

## 2022-04-06 NOTE — Telephone Encounter (Signed)
Called [facility] for bills is ready for pickup due to pt call again this bill from 2 years for labs  given from taylor [facility]arris

## 2022-08-11 ENCOUNTER — Other Ambulatory Visit: Payer: Self-pay

## 2022-08-11 ENCOUNTER — Encounter: Payer: Self-pay | Admitting: Emergency Medicine

## 2022-08-11 ENCOUNTER — Emergency Department: Payer: No Typology Code available for payment source

## 2022-08-11 ENCOUNTER — Emergency Department
Admission: EM | Admit: 2022-08-11 | Discharge: 2022-08-11 | Disposition: A | Discharge status: Home / Self Care | Payer: No Typology Code available for payment source | Attending: Emergency Medicine | Admitting: Emergency Medicine

## 2022-08-11 DIAGNOSIS — R002 Palpitations: Secondary | ICD-10-CM | POA: Insufficient documentation

## 2022-08-11 DIAGNOSIS — R079 Chest pain, unspecified: Secondary | ICD-10-CM | POA: Insufficient documentation

## 2022-08-11 LAB — TSH: TSH: 1.915 u[IU]/mL (ref 0.350–4.500)

## 2022-08-11 LAB — CBC
HCT: 39.8 % (ref 36.0–46.0)
Hemoglobin: 14.1 g/dL (ref 12.0–15.0)
MCH: 31 pg (ref 26.0–34.0)
MCHC: 35.4 g/dL (ref 30.0–36.0)
MCV: 87.5 fL (ref 80.0–100.0)
Platelets: 242 10*3/uL (ref 150–400)
RBC: 4.55 MIL/uL (ref 3.87–5.11)
RDW: 12.3 % (ref 11.5–15.5)
WBC: 6.4 10*3/uL (ref 4.0–10.5)
nRBC: 0 % (ref 0.0–0.2)

## 2022-08-11 LAB — TROPONIN I (HIGH SENSITIVITY)
Troponin I (High Sensitivity): <2 ng/L (ref <18)
Troponin I (High Sensitivity): <2 ng/L (ref <18)

## 2022-08-11 LAB — BASIC METABOLIC PANEL
Anion gap: 6 (ref 5–15)
BUN: 15 mg/dL (ref 6–20)
CO2: 25 mmol/L (ref 22–32)
Calcium: 9.2 mg/dL (ref 8.9–10.3)
Chloride: 106 mmol/L (ref 98–111)
Creatinine, Ser: 0.8 mg/dL (ref 0.44–1.00)
GFR, Estimated: >60 mL/min (ref >60)
Glucose, Bld: 108 mg/dL — ABNORMAL HIGH (ref 70–99)
Potassium: 3.4 mmol/L — ABNORMAL LOW (ref 3.5–5.1)
Sodium: 137 mmol/L (ref 135–145)

## 2022-08-11 LAB — T4, FREE: Free T4: 0.71 ng/dL (ref 0.61–1.12)

## 2022-08-11 NOTE — ED Triage Notes (Signed)
Pt via POV from home. Pt c/o palpitations, states that it has been going for 3 weeks but yesterday she got to a point where she was SOB. States her pain is intermittent and a 1/10 at this time. Denies any major medical hx. Pt is A&Ox4 and NAD

## 2022-08-11 NOTE — Discharge Instructions (Addendum)
You were seen in the emergency department for chest pain and heart palpitations.  You had heart enzymes done that were negative, do not believe you are having a heart attack today.  You had a chest x-ray that was normal.  Your vital signs were normal in the emergency department.  You have thyroid studies done but these were not yet resulted.  You were given a referral to cardiology to discuss Holter monitor for your heart palpitations.  Call and follow-up closely with your primary care physician.  Return to the emergency department if you have worsening chest pain or shortness of breath.  Decrease any caffeine intake

## 2022-08-11 NOTE — ED Provider Notes (Signed)
[facility] Provider Note    Event Date/Time   First MD Initiated Contact with Patient 08/11/22 1728     (approximate)   History   Palpitations   HPI  Brittany Olson is a 37 y.o. female past medical history significant for anxiety who presents to the emergency department with chest pain and heart palpitations.  States that over the past 3 weeks she has been feeling like her heart is skipping a beat and will catch and have a sharp stabbing chest pain.  States that it is painful every time it happens and it is occurring more frequently.  No change with eating, position or exertion.  No shortness of breath.  No nausea, vomiting or diaphoresis.  Decreased appetite.  Denies unexplained weight loss or fever.  Denies cough.  Not on OCPs.  States that she is not taking any of her daily medications including no longer on Lexapro, Wellbutrin, Adderall.  Denies any alcohol use.  Denies any nicotine use.  Drinks approximately 1 cup of coffee a day.  Denies any significant other caffeine intake or energy drinks.  No history of DVT or PE.  No recent trip or travel.  No family history of coronary artery disease at a young age.  Marijuana use at nighttime to help her go to sleep.     Physical Exam   Triage Vital Signs: ED Triage Vitals  Enc Vitals Group     BP 08/11/22 1713 117/81     Pulse Rate 08/11/22 1713 (!) 59     Resp 08/11/22 1713 18     Temp 08/11/22 1713 98 F (36.7 C)     Temp src --      SpO2 08/11/22 1713 97 %     Weight 08/11/22 1558 135 lb (61.2 kg)     Height 08/11/22 1558 5\' 3"  (1.6 m)     Head Circumference --      Peak Flow --      Pain Score 08/11/22 1558 1     Pain Loc --      Pain Edu? --      Excl. in GC? --     Most recent vital signs: Vitals:   08/11/22 1713  BP: 117/81  Pulse: (!) 59  Resp: 18  Temp: 98 F (36.7 C)  SpO2: 97%    Physical Exam Constitutional:      Appearance: She is well-developed.  HENT:     Head:  Atraumatic.  Eyes:     Conjunctiva/sclera: Conjunctivae normal.  Cardiovascular:     Rate and Rhythm: Normal rate and regular rhythm.     Heart sounds: Normal heart sounds.  Pulmonary:     Effort: No respiratory distress.  Abdominal:     General: There is no distension.     Tenderness: There is no abdominal tenderness.  Musculoskeletal:        General: Normal range of motion.     Cervical back: Normal range of motion and neck supple.  Skin:    General: Skin is warm.  Neurological:     Mental Status: She is alert. Mental status is at baseline.     IMPRESSION / MDM / ASSESSMENT AND PLAN / ED COURSE  I reviewed the triage vital signs and the nursing notes.  Differential diagnosis including ACS, pneumonia, pleurisy, gastritis/PUD, stress-induced, PVCs, dysrhythmia, hyperthyroidism.  EKG  I, Corena Herter, the attending physician, personally viewed and interpreted this ECG.  Normal sinus rhythm.  Normal  intervals.  No chamber enlargement.  No significant ST elevation or depression.  No signs of acute ischemia or dysrhythmia.  No tachycardic or bradycardic dysrhythmias while on cardiac telemetry.  RADIOLOGY I independently reviewed imaging, my interpretation of imaging: Chest x-ray with no focal findings consistent with pneumonia  LABS (all labs ordered are listed, but only abnormal results are displayed) Labs interpreted as -    Labs Reviewed  BASIC METABOLIC PANEL - Abnormal; Notable for the following components:      Result Value   Potassium 3.4 (*)    Glucose, Bld 108 (*)    All other components within normal limits  CBC  T4, FREE  TSH  POC URINE PREG, ED  TROPONIN I (HIGH SENSITIVITY)  TROPONIN I (HIGH SENSITIVITY)     MDM  No change with position, no ongoing symptoms, no recent viral illness, no EKG changes consistent with acute pericarditis.  No tearing chest pain have a low suspicion for dissection.  PERC negative, doubt pulmonary embolism.  No tachycardic  or bradycardic dysrhythmias on cardiac telemetry in the emergency department.  Patient did not provide a urine sample, states that she has no concern for pregnancy and just had her LMP last week.  Does not want a wait for urine sample.  Thyroid studies within normal limits.  Heart score of 1, serial troponins are negative.  Low suspicion for ACS.  Referral to cardiology for possible outpatient cardiac monitoring given her frequent heart palpitations.  Discussed close follow-up with her primary care physician.  Discussed avoiding marijuana and caffeine.  Given return precautions for any worsening symptoms.     PROCEDURES:  Critical Care performed: No  Procedures  Patient's presentation is most consistent with acute presentation with potential threat to life or bodily function.   MEDICATIONS ORDERED IN ED: Medications - No data to display  FINAL CLINICAL IMPRESSION(S) / ED DIAGNOSES   Final diagnoses:  Palpitations     Rx / DC Orders   ED Discharge Orders          Ordered    Ambulatory referral to Cardiology       Comments: If you have not heard from the Cardiology office within the next 72 hours please call [phone].   08/11/22 1815             Note:  This document was prepared using Dragon voice recognition software and may include unintentional dictation errors.   Corena Herter, MD 08/11/22 [phone]

## 2024-01-31 ENCOUNTER — Other Ambulatory Visit: Payer: Self-pay | Admitting: Emergency Medicine

## 2024-01-31 DIAGNOSIS — Z1231 Encounter for screening mammogram for malignant neoplasm of breast: Secondary | ICD-10-CM

## 2024-03-03 ENCOUNTER — Encounter

## 2024-04-05 ENCOUNTER — Other Ambulatory Visit: Payer: Self-pay | Admitting: Emergency Medicine

## 2024-04-05 ENCOUNTER — Ambulatory Visit
Admission: RE | Admit: 2024-04-05 | Discharge: 2024-04-05 | Disposition: A | Discharge status: Home / Self Care | Payer: Self-pay | Source: Ambulatory Visit | Attending: Emergency Medicine | Admitting: Emergency Medicine

## 2024-04-05 DIAGNOSIS — Z1231 Encounter for screening mammogram for malignant neoplasm of breast: Secondary | ICD-10-CM | POA: Insufficient documentation

## 2024-04-07 ENCOUNTER — Encounter: Payer: Self-pay | Admitting: Emergency Medicine

## 2024-04-11 ENCOUNTER — Other Ambulatory Visit: Payer: Self-pay | Admitting: Emergency Medicine

## 2024-04-11 DIAGNOSIS — R928 Other abnormal and inconclusive findings on diagnostic imaging of breast: Secondary | ICD-10-CM

## 2024-04-12 ENCOUNTER — Ambulatory Visit
Admission: RE | Admit: 2024-04-12 | Discharge: 2024-04-12 | Disposition: A | Payer: Self-pay | Source: Ambulatory Visit | Attending: Emergency Medicine | Admitting: Emergency Medicine

## 2024-04-12 ENCOUNTER — Ambulatory Visit
Admission: RE | Admit: 2024-04-12 | Discharge: 2024-04-12 | Disposition: A | Discharge status: Home / Self Care | Payer: Self-pay | Source: Ambulatory Visit | Attending: Emergency Medicine | Admitting: Emergency Medicine

## 2024-04-12 DIAGNOSIS — R928 Other abnormal and inconclusive findings on diagnostic imaging of breast: Secondary | ICD-10-CM

## 2024-04-18 ENCOUNTER — Telehealth: Payer: Self-pay | Admitting: Podiatry
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
